# Patient Record
Sex: Female | Born: 1980 | Hispanic: Yes | Smoking: Never smoker
Health system: Southern US, Community
[De-identification: ages and names within clinical notes are randomized; demographics above are authoritative.]

## PROBLEM LIST (undated history)

## (undated) DIAGNOSIS — Z789 Other specified health status: Secondary | ICD-10-CM

## (undated) DIAGNOSIS — A159 Respiratory tuberculosis unspecified: Secondary | ICD-10-CM

## (undated) HISTORY — DX: Other specified health status: Z78.9

## (undated) HISTORY — PX: OTHER SURGICAL HISTORY: SHX169

## (undated) HISTORY — DX: Respiratory tuberculosis unspecified: A15.9

---
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
## 2004-12-19 ENCOUNTER — Ambulatory Visit (HOSPITAL_COMMUNITY): Admission: RE | Admit: 2004-12-19 | Discharge: 2004-12-19 | Payer: Self-pay | Admitting: Obstetrics and Gynecology

## 2009-01-25 ENCOUNTER — Emergency Department (HOSPITAL_COMMUNITY): Admission: EM | Admit: 2009-01-25 | Discharge: 2009-01-25 | Payer: Self-pay | Admitting: Emergency Medicine

## 2009-02-13 ENCOUNTER — Encounter: Payer: Self-pay | Admitting: Maternal & Fetal Medicine

## 2009-02-23 ENCOUNTER — Encounter: Payer: Self-pay | Admitting: Maternal & Fetal Medicine

## 2009-06-22 ENCOUNTER — Encounter: Payer: Self-pay | Admitting: Obstetrics & Gynecology

## 2009-07-03 ENCOUNTER — Encounter: Payer: Self-pay | Admitting: Maternal & Fetal Medicine

## 2009-07-03 ENCOUNTER — Ambulatory Visit: Payer: Self-pay | Admitting: Family Medicine

## 2009-07-29 ENCOUNTER — Observation Stay: Payer: Self-pay | Admitting: Obstetrics and Gynecology

## 2009-08-07 ENCOUNTER — Inpatient Hospital Stay: Payer: Self-pay | Admitting: Obstetrics and Gynecology

## 2010-07-26 LAB — GC/CHLAMYDIA PROBE AMP, GENITAL
Chlamydia, DNA Probe: NEGATIVE
GC Probe Amp, Genital: NEGATIVE

## 2010-07-26 LAB — CBC
HCT: 41 % (ref 36.0–46.0)
Hemoglobin: 14.3 g/dL (ref 12.0–15.0)
MCHC: 34.8 g/dL (ref 30.0–36.0)
RBC: 4.53 MIL/uL (ref 3.87–5.11)
WBC: 9.4 10*3/uL (ref 4.0–10.5)

## 2010-07-26 LAB — URINALYSIS, ROUTINE W REFLEX MICROSCOPIC
Glucose, UA: NEGATIVE mg/dL
Nitrite: NEGATIVE
Specific Gravity, Urine: 1.01 (ref 1.005–1.030)
Urobilinogen, UA: 0.2 mg/dL (ref 0.0–1.0)

## 2010-07-26 LAB — WET PREP, GENITAL
Trich, Wet Prep: NONE SEEN
Yeast Wet Prep HPF POC: NONE SEEN

## 2010-07-26 LAB — DIFFERENTIAL
Basophils Absolute: 0 10*3/uL (ref 0.0–0.1)
Lymphocytes Relative: 17 % (ref 12–46)
Neutro Abs: 7.1 10*3/uL (ref 1.7–7.7)

## 2010-07-26 LAB — URINE CULTURE: Colony Count: >=100000

## 2010-07-26 LAB — URINE MICROSCOPIC-ADD ON

## 2010-07-26 LAB — PREGNANCY, URINE: Preg Test, Ur: POSITIVE

## 2014-08-03 ENCOUNTER — Other Ambulatory Visit (HOSPITAL_COMMUNITY): Payer: Self-pay | Admitting: Physician Assistant

## 2014-08-03 DIAGNOSIS — R102 Pelvic and perineal pain: Secondary | ICD-10-CM

## 2014-08-05 ENCOUNTER — Other Ambulatory Visit (HOSPITAL_COMMUNITY): Payer: Self-pay | Admitting: Physician Assistant

## 2014-08-05 DIAGNOSIS — R102 Pelvic and perineal pain unspecified side: Secondary | ICD-10-CM

## 2014-08-05 DIAGNOSIS — R1011 Right upper quadrant pain: Secondary | ICD-10-CM

## 2014-08-05 DIAGNOSIS — R1031 Right lower quadrant pain: Secondary | ICD-10-CM

## 2014-08-08 ENCOUNTER — Ambulatory Visit (HOSPITAL_COMMUNITY): Payer: Self-pay

## 2014-08-08 ENCOUNTER — Ambulatory Visit (HOSPITAL_COMMUNITY)
Admission: RE | Admit: 2014-08-08 | Discharge: 2014-08-08 | Disposition: A | Discharge status: Home / Self Care | Payer: Self-pay | Source: Ambulatory Visit | Attending: Physician Assistant | Admitting: Physician Assistant

## 2014-08-08 ENCOUNTER — Other Ambulatory Visit (HOSPITAL_COMMUNITY): Payer: Self-pay | Admitting: Physician Assistant

## 2014-08-08 DIAGNOSIS — R102 Pelvic and perineal pain: Secondary | ICD-10-CM | POA: Insufficient documentation

## 2014-08-08 DIAGNOSIS — R1011 Right upper quadrant pain: Secondary | ICD-10-CM

## 2014-08-08 DIAGNOSIS — R1031 Right lower quadrant pain: Secondary | ICD-10-CM | POA: Insufficient documentation

## 2019-03-16 ENCOUNTER — Encounter (HOSPITAL_COMMUNITY): Payer: Self-pay

## 2019-03-16 ENCOUNTER — Emergency Department (HOSPITAL_COMMUNITY): Payer: No Typology Code available for payment source

## 2019-03-16 ENCOUNTER — Other Ambulatory Visit: Payer: Self-pay

## 2019-03-16 ENCOUNTER — Emergency Department (HOSPITAL_COMMUNITY)
Admission: EM | Admit: 2019-03-16 | Discharge: 2019-03-16 | Disposition: A | Discharge status: Home / Self Care | Payer: No Typology Code available for payment source | Attending: Emergency Medicine | Admitting: Emergency Medicine

## 2019-03-16 DIAGNOSIS — T148XXA Other injury of unspecified body region, initial encounter: Secondary | ICD-10-CM | POA: Insufficient documentation

## 2019-03-16 DIAGNOSIS — T07XXXA Unspecified multiple injuries, initial encounter: Secondary | ICD-10-CM

## 2019-03-16 DIAGNOSIS — Y9389 Activity, other specified: Secondary | ICD-10-CM | POA: Diagnosis not present

## 2019-03-16 DIAGNOSIS — S0990XA Unspecified injury of head, initial encounter: Secondary | ICD-10-CM | POA: Diagnosis present

## 2019-03-16 DIAGNOSIS — Y92410 Unspecified street and highway as the place of occurrence of the external cause: Secondary | ICD-10-CM | POA: Diagnosis not present

## 2019-03-16 DIAGNOSIS — Y999 Unspecified external cause status: Secondary | ICD-10-CM | POA: Diagnosis not present
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
## 2019-03-16 NOTE — Discharge Instructions (Signed)
Rest as needed.  Use ice on sore areas 3 times a day for 2 days after that use heat.  For pain use ibuprofen 400 mg 3 times a day with meals.  See your doctor if you are not better in 1 week.
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
## 2020-02-15 ENCOUNTER — Other Ambulatory Visit: Payer: Self-pay

## 2020-02-15 ENCOUNTER — Ambulatory Visit (INDEPENDENT_AMBULATORY_CARE_PROVIDER_SITE_OTHER): Payer: Self-pay | Admitting: Dermatology

## 2020-02-15 DIAGNOSIS — L821 Other seborrheic keratosis: Secondary | ICD-10-CM

## 2020-02-15 NOTE — Progress Notes (Signed)
   Follow-Up Visit   Subjective  Melissa Odonnell is a 39 y.o. female who presents for the following: dark papules on the face (patient would like to discuss removal and treatment options).  She has had these removed in past several years ago here in office, but they have gradually come back.  The following portions of the chart were reviewed this encounter and updated as appropriate:     Review of Systems:  No other skin or systemic complaints except as noted in HPI or Assessment and Plan.  Objective  Well appearing patient in no apparent distress; mood and affect are within normal limits.  A focused examination was performed including the face . Relevant physical exam findings are noted in the Assessment and Plan.  Objective  Face: Cheeks, forehead, temples - multiple tiny waxy tan papules  Images        Assessment & Plan  Seborrheic keratosis Face  Discussed cosmetic removal (ED) and cost with patient. She would like to remove as many as possible today for a flat rate of $350. We treated >100 today. Will touch up once at no charge. Recommend Vaseline for healing lesions. Recommend photoprotection while healing.  Destruction of lesion - Face  Destruction method comment:  Electrodesiccation performed today Informed consent: discussed and consent obtained   Timeout:  patient name, date of birth, surgical site, and procedure verified Hemostasis achieved with:  electrodesiccation Outcome: patient tolerated procedure well with no complications   Post-procedure details: wound care instructions given     Return in about 7 weeks (around 04/04/2020) for SK follow up - may need to be retreated.  Maylene Roes, CMA, am acting as scribe for Willeen Niece, MD .  Documentation: I have reviewed the above documentation for accuracy and completeness, and I agree with the above.  Willeen Niece MD

## 2020-04-10 ENCOUNTER — Ambulatory Visit (INDEPENDENT_AMBULATORY_CARE_PROVIDER_SITE_OTHER): Payer: Self-pay | Admitting: Dermatology

## 2020-04-10 ENCOUNTER — Other Ambulatory Visit: Payer: Self-pay

## 2020-04-10 DIAGNOSIS — L821 Other seborrheic keratosis: Secondary | ICD-10-CM

## 2020-04-10 NOTE — Patient Instructions (Addendum)
Cryotherapy Aftercare  . Wash gently with soap and water everyday.   Marland Kitchen Apply Vaseline and Band-Aid daily until healed.   Recommend broad spectrum SPF and photoprotection.

## 2020-04-10 NOTE — Progress Notes (Signed)
° °  Follow-Up Visit   Subjective  Melissa Odonnell is a 39 y.o. female who presents for the following: Follow-up (Cosmetic Sks of the face >100 treated with ED, improving ).  Here for post-op visit and touch up treatment.  A little improved, but still a lot there.   The following portions of the chart were reviewed this encounter and updated as appropriate:       Review of Systems:  No other skin or systemic complaints except as noted in HPI or Assessment and Plan.  Objective  Well appearing patient in no apparent distress; mood and affect are within normal limits.  A focused examination was performed including face. Relevant physical exam findings are noted in the Assessment and Plan.  Objective  Right Cheek x 19, Left cheek x 20 (39): Tiny waxy tan papules.   Assessment & Plan  Seborrheic keratosis (39) Right Cheek x 19, Left cheek x 20  Some improvement with ED treatment. Photos compared.  Will touch-up larger areas with LN2 today. No charge. Prior to procedure, discussed risks of blister formation, small wound, skin dyspigmentation, or rare scar following cryotherapy.    Recommend broad spectrum SPF and photoprotection     Destruction of lesion - Right Cheek x 19, Left cheek x 20  Destruction method: cryotherapy   Informed consent: discussed and consent obtained   Lesion destroyed using liquid nitrogen: Yes   Region frozen until ice ball extended beyond lesion: Yes   Outcome: patient tolerated procedure well with no complications   Post-procedure details: wound care instructions given    Return in about 8 weeks (around 06/05/2020) for f/u SKs.   ICherlyn Labella, CMA, am acting as scribe for Willeen Niece, MD .  Documentation: I have reviewed the above documentation for accuracy and completeness, and I agree with the above.  Willeen Niece MD

## 2020-06-05 ENCOUNTER — Ambulatory Visit: Payer: Self-pay | Admitting: Dermatology
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
## 2022-06-26 ENCOUNTER — Ambulatory Visit: Payer: Self-pay

## 2022-06-26 ENCOUNTER — Ambulatory Visit (LOCAL_COMMUNITY_HEALTH_CENTER): Payer: Self-pay

## 2022-06-26 DIAGNOSIS — Z719 Counseling, unspecified: Secondary | ICD-10-CM

## 2022-06-26 DIAGNOSIS — Z23 Encounter for immunization: Secondary | ICD-10-CM

## 2022-06-26 NOTE — Progress Notes (Signed)
Pt seen in nurse clinic for Immigration vaccines. Pt needs Hep B, Tdap, MMR, Varicella, Flu and Covid vaccines. Pt is uninsured, paid Flu vaccine, eligible for free Twinrix ( pt plan to travel to [place] once she completed paper work this year), Tdap, Varicella and MMR per standing order. Pt re-schedule appt. for Covid vaccine this coming Monday 07/01/22. Administered Twinrix, Tdap, MMR, Varicella and  Flu vaccines, tolerated well. Provided VIS statements and NCIR copies, informed of the next vaccine visits, pt verbalized understanding. Also informed pt that she maybe qualify for free vaccines through [employer], provided [employer] application form. M.Jeramiah Mccaughey, LPN.

## 2022-07-01 ENCOUNTER — Ambulatory Visit (LOCAL_COMMUNITY_HEALTH_CENTER): Payer: Self-pay

## 2022-07-01 ENCOUNTER — Ambulatory Visit: Payer: Self-pay

## 2022-07-01 DIAGNOSIS — Z23 Encounter for immunization: Secondary | ICD-10-CM

## 2022-07-01 DIAGNOSIS — Z719 Counseling, unspecified: Secondary | ICD-10-CM

## 2022-07-01 NOTE — Progress Notes (Signed)
Patient seen in nurse clinic for COVID vaccine.  Moderna/Spikevax +12Y 2023-24 IM left deltoid. VIS provided. Tolerated well. Waited 10 minutes. 2 copies of NCIR provided. Discussed return dates for vaccine shots.   Patient completed [employer] Application for Varicella and provided tax returns. Varicella recommended for 07/24/2022.  Patient has appointment for [id number] - application and forms waiting to be seen to [employer].

## 2022-07-18 ENCOUNTER — Ambulatory Visit (LOCAL_COMMUNITY_HEALTH_CENTER): Payer: Self-pay

## 2022-07-18 ENCOUNTER — Other Ambulatory Visit: Payer: Self-pay

## 2022-07-18 VITALS — Wt 134.0 lb

## 2022-07-18 DIAGNOSIS — R7612 Nonspecific reaction to cell mediated immunity measurement of gamma interferon antigen response without active tuberculosis: Secondary | ICD-10-CM

## 2022-07-18 NOTE — Progress Notes (Signed)
Patient referred by Cindie Laroche, DO [place], [place]. Patient was seen by DO for immigration purposes.   +QFT3/08/2022 CXR  07/04/2022 EPI 07/18/2022   Nurse reviewed with patient the differences between LTBI and active TB disease. Explained that it is a 4 month commitment [place]. RN informed patient that it is free and optional. Patient given opportunity to ask questions.   Patient denies TB sxs, alcohol consumption and smoking. NKA. Patient has had 2 c-sections and is not using BC. Patient reports she is using condoms. LMP 07/09/2022.    Patient would like to start LTBI treatment. Appointment scheduled for Tuesday 08/06/2022 at 4:00 PM.  Servando Salina, RN

## 2022-07-23 ENCOUNTER — Telehealth: Payer: Self-pay

## 2022-07-23 NOTE — Telephone Encounter (Signed)
Call to patient with Melissa Odonnell ([place]). RN reviewed [facility] with patient who reports she is using condoms. RN informed patient that she needs to use them. Patient verbalized understanding.  Servando Salina, RN

## 2022-07-25 ENCOUNTER — Ambulatory Visit: Payer: Self-pay

## 2022-07-25 ENCOUNTER — Ambulatory Visit (LOCAL_COMMUNITY_HEALTH_CENTER): Payer: Self-pay

## 2022-07-25 DIAGNOSIS — Z23 Encounter for immunization: Secondary | ICD-10-CM

## 2022-07-25 DIAGNOSIS — Z719 Counseling, unspecified: Secondary | ICD-10-CM

## 2022-07-25 NOTE — Progress Notes (Signed)
Pt returned to nurse clinic for 2nd dose Twinrix, MMR and Varicella needs for Immigration. Pt meets criteria for free Twinrix, and MMR from state supply and free Varicella vaccine approved by [employer] from private supply. Administered vaccines, tolerated well. Given VIS and NCIR copies, explained and understood. M.Chenee Munns, LPN.

## 2022-07-30 ENCOUNTER — Other Ambulatory Visit: Payer: Self-pay

## 2022-07-30 NOTE — Progress Notes (Signed)
Patient is 42 yr old referred by Sanda Klein, DO in [place], [place].    +QFT3/08/2022 CXR  07/04/2022 EPI 3/28/202   Tuberculosis treatment orders   All patients are to be monitored per [place] and county TB policies.    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 Treat for latent TB per the following:  Rifampin 600mg  daily by mouth x 4 months per Dr. Wyvonnia Lora Standing Orders.  Offer HIV/Syphilis   Patient only needs labs if concerning symptoms arise, new potentially hepatotoxic medications, or significant alcohol consumption is reported that would require monitoring.    Augustin Schooling, RN

## 2022-08-06 ENCOUNTER — Ambulatory Visit (LOCAL_COMMUNITY_HEALTH_CENTER): Payer: Self-pay

## 2022-08-06 VITALS — Wt 137.0 lb

## 2022-08-06 DIAGNOSIS — R7612 Nonspecific reaction to cell mediated immunity measurement of gamma interferon antigen response without active tuberculosis: Secondary | ICD-10-CM

## 2022-08-06 LAB — HM HIV SCREENING LAB: HM HIV Screening: NEGATIVE

## 2022-08-06 MED ORDER — RIFAMPIN 300 MG PO CAPS: 600.0000 mg | ORAL_CAPSULE | Freq: Every day | ORAL | 0 refills | Status: AC

## 2022-08-06 NOTE — Progress Notes (Signed)
In nurse clinic for LTBI / TB Med Start / Rifampin #1 / Labs: HIV and RPR Patient speaks and understands [place], declines interpreter today.   Current meds: claritin and multivitamin Last alcohol 2 yrs ago. Counseled about no alcohol while on Rifampin Patient states sexually active, no birth control method. Counseled by RN about not becoming pregnant while on Rifampin. Pt verbalizes understanding. Condoms declined today.  Patient counseled regarding decreased efficacy of hormone based birth control while taking Rifampin. Patient reminded of the importance using a back-up method of birth control while taking Rifampin. Patient verbalized understanding.    Latent vs Active TB info sheet  and Rifampin info sheet given and reviewed.  LTBI Tx Consent signed. HIV testing consent signed. TB coord card given and advised to contact with questions, concerns, side effects.   The patient was dispensed Rifampin 300 mg #60 (bottle #1)today per order by Dr Levonne Hubert. I provided counseling today regarding the medication. We discussed the medication, the side effects and when to call clinic. Patient given the opportunity to ask questions. Questions answered.    RN walked pt to lab for HIV and RPR today.  Next TB med appt scheduled 09/03/2022 at 3:15 per pt preference. ROI not signed today and plan to have pt sign at next visit. Jerel Shepherd, RN

## 2022-08-23 ENCOUNTER — Ambulatory Visit: Payer: Self-pay

## 2022-08-26 ENCOUNTER — Ambulatory Visit: Payer: Self-pay

## 2022-08-26 ENCOUNTER — Ambulatory Visit (LOCAL_COMMUNITY_HEALTH_CENTER): Payer: Self-pay

## 2022-08-26 DIAGNOSIS — Z719 Counseling, unspecified: Secondary | ICD-10-CM

## 2022-08-26 NOTE — Progress Notes (Signed)
Patient seen in nurse clinic for immunizations.  Patient stated she was here "for 2nd shot".  Patient did not know the vaccine needed.  Review of immunization record showed no vaccinations needed until 12/27/2022.  NCIR printed and reviewed with patient.  Next vaccination Twinrix due 12/27/2022.  NCIR copy provided to patient. Discussed calling in first of August to schedule for September appointment.

## 2022-09-03 ENCOUNTER — Ambulatory Visit (LOCAL_COMMUNITY_HEALTH_CENTER): Payer: Self-pay

## 2022-09-03 VITALS — Wt 134.5 lb

## 2022-09-03 DIAGNOSIS — R7612 Nonspecific reaction to cell mediated immunity measurement of gamma interferon antigen response without active tuberculosis: Secondary | ICD-10-CM

## 2022-09-03 MED ORDER — RIFAMPIN 300 MG PO CAPS: 600.0000 mg | ORAL_CAPSULE | Freq: Every day | ORAL | 0 refills | Status: AC

## 2022-09-03 NOTE — Progress Notes (Signed)
MD Attestation for TB RN: I agree with the care provided to this patient and the plan for follow up and treatment.  Dalynn Jhaveri M. Montrice Gracey, MD  

## 2022-09-03 NOTE — Progress Notes (Signed)
In nurse clinic for LTBI / TB Med Management / Rifampin #2 / LFT's  Patient reports taking Rifampin as prescribed. Denies missing any pills. Has #4 pills remaining in bottle.   Patient explains she has had a "little bit" of abd pain each week but not every day. States she has not paid much attention to it.  Also explains she has noticed "brown" urine approx twice this past month. Patient relates this to not drinking enough water. RN counseled on staying hydrated.  Patient notes that after taking one dose of Dextromethorphan for cough, she noticed small rash on back that disappeared the next day and no problems since.   Consult Dr Wyvonnia Lora who orders LFT's today and to continue Rifampin as prescribed.   RN discussed provider recommendations and pt in agreement.   TB coord contact card given and advised to contact with questions, concerns, side effects.  Declines Rifampin info sheet as she has one at home.   The patient was dispensed Rifampin 300 mg #60 today per order by Dr Levonne Hubert. I provided counseling today regarding the medication. We discussed the medication, the side effects and when to call clinic. Patient given the opportunity to ask questions. Questions answered.    Patient counseled regarding decreased efficacy of hormone based birth control while taking Rifampin. Patient reminded of the importance using a back-up method of birth control while taking Rifampin. Patient verbalized understanding.  Condoms accepted today.   RN walked pt to lab for LFT's. Next TB med appt scheduled for 10/01/2022 at 3:15 pm per pt preference. Jerel Shepherd, RN

## 2022-09-04 LAB — HEPATIC FUNCTION PANEL
ALT: 16 IU/L (ref 0–32)
AST: 20 IU/L (ref 0–40)
Albumin: 4.5 g/dL (ref 3.9–4.9)
Alkaline Phosphatase: 70 IU/L (ref 44–121)
Bilirubin Total: <0.2 mg/dL (ref 0.0–1.2)
Bilirubin, Direct: <0.1 mg/dL (ref 0.00–0.40)
Total Protein: 7.2 g/dL (ref 6.0–8.5)

## 2022-09-25 DIAGNOSIS — R7612 Nonspecific reaction to cell mediated immunity measurement of gamma interferon antigen response without active tuberculosis: Secondary | ICD-10-CM | POA: Insufficient documentation

## 2022-10-01 ENCOUNTER — Ambulatory Visit (LOCAL_COMMUNITY_HEALTH_CENTER): Payer: Self-pay

## 2022-10-01 DIAGNOSIS — R7612 Nonspecific reaction to cell mediated immunity measurement of gamma interferon antigen response without active tuberculosis: Secondary | ICD-10-CM

## 2022-10-01 DIAGNOSIS — Z227 Latent tuberculosis: Secondary | ICD-10-CM

## 2022-10-01 MED ORDER — RIFAMPIN 300 MG PO CAPS: 600.0000 mg | ORAL_CAPSULE | Freq: Every day | ORAL | 0 refills | Status: AC

## 2022-10-01 NOTE — Progress Notes (Signed)
In nurse clinic for LTBI / Med Management / Rifampin #3. Patient has been taking Rifampin 600mg  daily for 2 months for LTBI treatment.  Patient is doing well on current medication regimen. No n/v/f/c, eating normally, no concerning weight loss. Patient reports taking medications daily as prescribed.  Patient has about 12 pills left and states she has missed taking her pills for 1 day. Patient advised to finish the remaining pills in their old bottle, then start on the next bottle of medication.  Dispensed #3 month of Rifampin for LTBI tx. Dispensed #60 300 mg capsules.   I provided counseling today regarding the medication, we discussed the medication, the side effects and when to call clinic. Patient given the opportunity to ask questions.   Patient advised to contact [facility]/TB control phone for any concerning symptoms or questions.  Patient's next visit has been scheduled for: 10/30/22 at 3:15PM.  Abagail Kitchens, RN

## 2022-10-30 ENCOUNTER — Ambulatory Visit (LOCAL_COMMUNITY_HEALTH_CENTER): Payer: Self-pay

## 2022-10-30 VITALS — Wt 137.5 lb

## 2022-10-30 DIAGNOSIS — R7612 Nonspecific reaction to cell mediated immunity measurement of gamma interferon antigen response without active tuberculosis: Secondary | ICD-10-CM

## 2022-10-30 MED ORDER — RIFAMPIN 300 MG PO CAPS: 600.0000 mg | ORAL_CAPSULE | Freq: Every day | ORAL | 0 refills | Status: AC

## 2022-10-30 NOTE — Progress Notes (Signed)
In nurse clinic for LTBI / TB Med Completion/ Rifampin # 4  / Labs: None today  Taking Rifampin as prescribed. Per pt, missed one day of Rifampin last month.  #14 pills  (7 day supply) remaining in current bottle.  Advised to complete this bottle before starting bottle that will be dispensed today.   No Changes in  Meds  New Health problems:  patient explains she has had R sided pelvic pain "for years" but is noticing it more often  and has always felt  a "ball" lower R pelvic area. No PCP and RN gave list of local PCP and encouraged to establish. [facility] services also reviewed.   Patient states she wants to get pregnant sometime after stopping Rifampin. RN counseled patient to wait one month after completing Rifampin before seeking pregnancy. "Are you ready for baby?" Booklet given. Preconception and achieving preg counseling given.   Alcohol: denies. Advised to abstain from alcohol as Rifampin can damage the liver.   BCM:  Patient counseled regarding decreased efficacy of hormone based birth control while taking Rifampin. Patient reminded of the importance using a back-up method of birth control while taking Rifampin. Patient verbalized understanding. Condoms declined. Patient reports not using   The patient was dispensed Rifampin 300 mg #60(Rifampin #4) today per order by Dr Levonne Hubert. I provided counseling today regarding the medication. We discussed the medication, the side effects and when to call clinic. Patient given the opportunity to ask questions. Questions answered.    TB contact card given and advised to contact with questions, concerns, side effects.   TB completion patient letter (Spanish and [place]) given and explained.  TB completion card given.  TB contact card given.  Questions answered and reports understanding. Jerel Shepherd, RN

## 2022-11-25 ENCOUNTER — Ambulatory Visit: Payer: Self-pay

## 2022-12-16 ENCOUNTER — Ambulatory Visit: Payer: Self-pay

## 2023-01-07 ENCOUNTER — Ambulatory Visit (LOCAL_COMMUNITY_HEALTH_CENTER): Payer: Self-pay | Admitting: Family Medicine

## 2023-01-07 ENCOUNTER — Encounter: Payer: Self-pay | Admitting: Family Medicine

## 2023-01-07 VITALS — BP 105/57 | HR 64 | Ht 61.6 in | Wt 135.4 lb

## 2023-01-07 DIAGNOSIS — Z113 Encounter for screening for infections with a predominantly sexual mode of transmission: Secondary | ICD-10-CM

## 2023-01-07 DIAGNOSIS — Z01419 Encounter for gynecological examination (general) (routine) without abnormal findings: Secondary | ICD-10-CM

## 2023-01-07 DIAGNOSIS — Z309 Encounter for contraceptive management, unspecified: Secondary | ICD-10-CM

## 2023-01-07 LAB — WET PREP FOR TRICH, YEAST, CLUE
Trichomonas Exam: NEGATIVE
Yeast Exam: NEGATIVE

## 2023-01-07 LAB — HM HIV SCREENING LAB: HM HIV Screening: NEGATIVE

## 2023-01-07 NOTE — Progress Notes (Unsigned)
PT is here ford PE, pap smear and STD testing.  Wet mount results reviewed, no treatment required per SO.  FP packet given.  Berdie Ogren, RN

## 2023-01-07 NOTE — Progress Notes (Unsigned)
[name] DEPARTMENT [facility] [address] Main Number: [phone]   Family Planning Visit- Initial Visit  Subjective:  Melissa Odonnell Melissa Odonnell is a 42 y.o.  [id number]   being seen today for an initial annual visit and to discuss reproductive life planning.  The patient is currently using Female Condom sometimes for pregnancy prevention. Patient reports   does want a pregnancy in the next year, "but I want my ovary fixed first.".    Patient reports they are not particularly looking for a method, but she would like some condoms to take home.   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  Chief Complaint  Patient presents with   Annual Exam    PE and pap smear    Patient reports vaginal burning and lower back pain when urinating. She endorses headaches that began with the start of TB medication, and an "inflamed ovary". Please see ROS for further details.    Patient denies multiple partners or same sex partners.   Body mass index is 25.09 kg/m. - Patient is eligible for diabetes screening based on BMI> 25 and age >35?  yes HA1C ordered? yes  Patient reports 1  partner/s in last year. Desires STI screening?  Yes  Has patient been screened once for HCV in the past?  No  No results found for: "HCVAB"  Does the patient have current drug use (including MJ), have a partner with drug use, and/or has been incarcerated since last result? No  If yes-- Screen for HCV through [facility]   Does the patient meet criteria for HBV testing? No  Criteria:  -Household, sexual or needle sharing contact with HBV -History of drug use -HIV positive -Those with known Hep C   Health Maintenance Due  Topic Date Due   Hepatitis C Screening  Never done   Cervical Cancer Screening (HPV/Pap Cotest)  Never done   COVID-19 Vaccine (2 - Moderna risk series) 07/29/2022   INFLUENZA VACCINE   11/21/2022    Review of Systems  Constitutional: Negative.   HENT: Negative.    Eyes: Negative.   Respiratory: Negative.    Cardiovascular: Negative.   Gastrointestinal:  Positive for abdominal pain.       Lower abdominal pain located approximately over the right ovary.   Genitourinary:  Positive for dysuria.       Patient describes vaginal burning with pain in her lower back when urinating. This started on 12/18/22, 2 days before her last menstrual cycle began on 12/20/22. She tried using cranberry juice and Monistat cream for a couple of days until her period began. She endorses the symptoms did improve but have come back.   She also states she has an "inflamed ovary" to the right side that fluctuates in size related to her menstrual cycle. She reports it is associated with a discomfort. She states this has been going on a while. She has not seen a provider about this or tried any at home treatments.     Musculoskeletal: Negative.   Skin:        Patient reports a darkening of the skin around her eyes bilaterally.   Neurological:  Positive for headaches.       Patient endorses headaches that began 1 month ago when she began TB treatment. She states they occur everyday and last from morning until night. She has not tried any treatment.   Endo/Heme/Allergies: Negative.  Psychiatric/Behavioral: Negative.      The following portions of the patient's history were reviewed and updated as appropriate: allergies, current medications, past family history, past medical history, past social history, past surgical history and problem list. Problem list updated.   See flowsheet for other program required questions.  Objective:   Vitals:   01/07/23 1421  BP: (!) 105/57  Pulse: 64  Weight: 135 lb 6.4 oz (61.4 kg)  Height: 5' 1.6" (1.565 m)    Physical Exam Vitals and nursing note reviewed. Exam conducted with a chaperone present (Irina T. Spanish interpreter chaperone present in the  room.).  Constitutional:      General: She is not in acute distress.    Appearance: Normal appearance. She is not ill-appearing.  HENT:     Head: Normocephalic.     Salivary Glands: Right salivary gland is not diffusely enlarged or tender. Left salivary gland is not diffusely enlarged or tender.     Mouth/Throat:     Lips: Pink.     Mouth: Mucous membranes are moist.     Tongue: No lesions.     Pharynx: Oropharynx is clear. Uvula midline. No oropharyngeal exudate.     Tonsils: No tonsillar exudate.  Eyes:     General:        Right eye: No discharge.        Left eye: No discharge.  Pulmonary:     Effort: Pulmonary effort is normal.  Chest:     Chest wall: Tenderness present. No mass.  Breasts:    Tanner Score is 5.     Right: Tenderness present. No swelling, bleeding, inverted nipple, mass, nipple discharge or skin change.     Left: No swelling, bleeding, inverted nipple, mass, nipple discharge, skin change or tenderness.  Abdominal:     General: Abdomen is flat. A surgical scar is present. There is no distension.     Palpations: Abdomen is soft. There is mass.     Tenderness: There is abdominal tenderness. There is right CVA tenderness. There is no left CVA tenderness, guarding or rebound.       Comments: Site marked with an X on diagram above: Patient reports tenderness to palpation over approximate location of right ovary. On palpation a possible mass is felt, but it cannot be determined without appropriate diagnostic testing (CT, TVUS) what this could be. Possibly 2-3 cm in size and not perfectly rounded. This mass does not protrude and is not obvious to the naked eye. Transverse mark on abdominal diagram above representative of a well healed surgical scar. This was approximately 16 cm across.  Genitourinary:    General: Normal vulva.     Exam position: Lithotomy position.     Pubic Area: No rash or pubic lice.      Tanner stage (genital): 5.     Labia:        Right: No  rash, tenderness or lesion.        Left: No rash, tenderness or lesion.      Vagina: No vaginal discharge.     Cervix: Discharge, friability and erythema present. No cervical motion tenderness.     Uterus: Normal.      Adnexa: Left adnexa normal.       Right: Mass and tenderness present.         Comments: Erythematous irregularly shaped area above cervical os to extending from about 10 o'clock to 1 o'clock. Possible lesion noted to 11 o'clock area, but due to  friability unable to definitively discern. Cytology with HPV obtained today.  Lymphadenopathy:     Head:     Right side of head: No submental, submandibular, tonsillar, preauricular or posterior auricular adenopathy.     Left side of head: No submental, submandibular, tonsillar, preauricular or posterior auricular adenopathy.     Cervical: No cervical adenopathy.     Right cervical: No superficial or posterior cervical adenopathy.    Left cervical: No superficial or posterior cervical adenopathy.     Upper Body:     Right upper body: No supraclavicular adenopathy.     Left upper body: No supraclavicular adenopathy.     Lower Body: No right inguinal adenopathy. No left inguinal adenopathy.  Skin:    General: Skin is warm and dry.          Comments: Darkening of the skin around eyes to the upper maxillary/lower orbital bones bilaterally. Possibly representative of melasma.   Neurological:     Mental Status: She is alert and oriented to person, place, and time.  Psychiatric:        Attention and Perception: Attention normal.        Mood and Affect: Mood normal.        Speech: Speech normal.        Behavior: Behavior normal. Behavior is cooperative.       Assessment and Plan:  Melissa Odonnell is a 42 y.o. female presenting to the [facility] Department for an initial annual wellness/contraceptive visit  Contraception counseling: Reviewed options based on patient desire and reproductive life plan. Patient  is interested in Female Condom. This was provided to the patient today.   Risks, benefits, and typical effectiveness rates were reviewed.  Questions were answered.  Written information was also given to the patient to review.    The patient will follow up as needed for surveillance.  The patient was told to call with any further questions, or with any concerns about this method of contraception.  Emphasized use of condoms 100% of the time for STI prevention.  Educated on ECP and assessed for need of ECP. Patient reported Unprotected sex within past 72 hours.  Reviewed options and patient desired No method of ECP, declined all    1. Screening for venereal disease  - Chlamydia/Gonorrhea [place] [facility] - HIV [place] [facility] - Syphilis Serology, [facility] - WET PREP FOR TRICH, YEAST, CLUE - Gonococcus culture   2. Well woman exam  Patient strongly encouraged to locate a PCP to establish care and obtain a complete head to toe physical exam with appropriate lab screenings. She was advised they would be the best option for seeking care related to her headaches.  She was strongly encouraged to seek care from an urgent care, ED, or PCP for symptoms of possible UTI.  She was encouraged to locate an OBGYN to inquire about a transvaginal US to evaluate her concerns of an inflamed ovary.  Patient referred to [facility] for mammogram.  - Hgb A1c w/o eAG - IGP, Aptima HPV   Return if symptoms worsen or fail to improve.  No future appointments.  Due to language barrier, an interpreter was present during the history-taking and subsequent discussion and for the physical exam with this patient.  Edmonia James, NP

## 2023-01-08 NOTE — Progress Notes (Signed)

## 2023-01-12 LAB — GONOCOCCUS CULTURE

## 2023-01-25 LAB — IGP, APTIMA HPV
HPV Aptima: NEGATIVE
PAP Smear Comment: 0

## 2023-01-25 LAB — SPECIMEN STATUS REPORT

## 2023-01-28 NOTE — Progress Notes (Signed)
Please send letter to patient to repeat PAP with HPV co-testing in 5 years. Cervical cytology from 01/07/23 reported as "unsatisfactory for evaluation;" however, HPV was negative. Reviewing ASCCP guidelines, a 5 year repeat PAP with HPV co-testing is appropriate.

## 2023-03-11 ENCOUNTER — Ambulatory Visit: Payer: Self-pay

## 2023-12-29 ENCOUNTER — Ambulatory Visit: Attending: Urology

## 2023-12-29 ENCOUNTER — Other Ambulatory Visit: Payer: Self-pay

## 2023-12-29 DIAGNOSIS — R279 Unspecified lack of coordination: Secondary | ICD-10-CM | POA: Insufficient documentation

## 2023-12-29 DIAGNOSIS — M5459 Other low back pain: Secondary | ICD-10-CM | POA: Diagnosis present

## 2023-12-29 DIAGNOSIS — R293 Abnormal posture: Secondary | ICD-10-CM | POA: Insufficient documentation

## 2023-12-29 DIAGNOSIS — M6281 Muscle weakness (generalized): Secondary | ICD-10-CM | POA: Diagnosis present

## 2023-12-29 DIAGNOSIS — M62838 Other muscle spasm: Secondary | ICD-10-CM | POA: Insufficient documentation

## 2023-12-29 DIAGNOSIS — R1030 Lower abdominal pain, unspecified: Secondary | ICD-10-CM | POA: Diagnosis present

## 2023-12-29 NOTE — Therapy (Signed)
 OUTPATIENT PHYSICAL THERAPY FEMALE PELVIC EVALUATION   Patient Name: Melissa Odonnell MRN: [id number] DOB:[birth year], 43 y.o., female Today's Date: 12/29/2023  END OF SESSION:  PT End of Session - 12/29/23 0931     Visit Number 1    Date for PT Re-Evaluation 06/14/24    Authorization Type [employer]    Authorization Time Period waiting on auth    PT Start Time 0931    PT Stop Time 1012    PT Time Calculation (min) 41 min    Activity Tolerance Patient tolerated treatment well    Behavior During Therapy [facility] for tasks assessed/performed          Past Medical History:  Diagnosis Date   Patient denies medical problems    TB (tuberculosis)    Past Surgical History:  Procedure Laterality Date   two c-sections     Patient Active Problem List   Diagnosis Date Noted   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    PCP: NA  REFERRING PROVIDER: Devere Lonni Righter, MD    REFERRING DIAG: 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 MD told her that she is not emptying bladder completely and that is why. She was was using catheters, but MD has told her to stop using.    PAIN:  Are you having pain? Yes NPRS scale: 5/10 Pain location: Rt side of low back; pressure in lower abdomen; Rt lower abdominal pain  Pain type: aching Pain description: intermittent    Aggravating factors: having to urinate, sitting, sleeping  Relieving factors: exercise  PRECAUTIONS: None  RED FLAGS: None   WEIGHT BEARING RESTRICTIONS: No  FALLS:  Has patient fallen in last 6 months? No  OCCUPATION: cleaning  ACTIVITY LEVEL : weight training, pilates  PLOF: Independent  PATIENT GOALS: be able to use the bathroom better   PERTINENT HISTORY:  2 c-sections, chronic UTIs  Sexual abuse: No  BOWEL MOVEMENT: Pain with bowel movement: No Type of bowel movement:Frequency 2x/day and Strain yes Fully empty rectum: Yes:   Leakage: No Pads: Yes: see  below Fiber supplement/laxative No  URINATION: Pain with urination: No Fully empty bladder: No Stream: starts and stops, sometimes slow stream Urgency: Yes - doesn't have to go, but has sensation she needs to go Frequency: up to 4x/hour during the day; 2x/night Fluid Intake: 60-90 oz; she does drink coffee  Leakage: none Pads: Yes: feels like there is a little there after she wipes and does not want this in her underwear  INTERCOURSE:  Ability to have vaginal penetration Yes  Pain with intercourse: Initial Penetration, During Penetration, and Deep Penetration DrynessYes  Climax: WNL [name] Scale: 2/3 Lubricant: no  PREGNANCY: Vaginal deliveries 0 Tearing No Episiotomy No C-section deliveries 2 Currently pregnant No  PROLAPSE: Pressure   OBJECTIVE:  Note: Objective measures were completed at Evaluation unless otherwise noted.  12/29/23: PATIENT SURVEYS:   PFIQ-7: 55  COGNITION: Overall cognitive status: Within functional limits for tasks assessed     SENSATION: Light touch: Appears intact   FUNCTIONAL TESTS:  Squat: WNL Single leg stance:  Rt: pelvic drop  Lt: pelvic drop Curl-up test: no distortion    GAIT: Assistive device utilized: None Comments: WNL  POSTURE: decreased lumbar lordosis, decreased thoracic kyphosis, and posterior pelvic  tilt   LUMBARAROM/PROM:  A/PROM A/PROM  Eval (% available)  Flexion 75  Extension 25, Rt abdominal pain  Right lateral flexion 75  Left lateral flexion 75  Right rotation 75  Left rotation 75   (Blank rows = not tested)  PALPATION:   General: rigger points Rt glutes   Pelvic Alignment: Rt posterior rotation  Abdominal: tightness throughout bil lower abdomen; increased pain and tightness in Rt lower quadrant compared to LT                External Perineal Exam: WNL                             Internal Pelvic Floor: significant tenderness throughout bil superficial and deep layers; burning in superficial, aching in deep  Patient confirms identification and approves PT to assess internal pelvic floor and treatment Yes  PELVIC MMT:   MMT eval  Vaginal 1/5, 3 repeat contractions  Diastasis Recti 2 finger widths   (Blank rows = not tested)        TONE: High, bil  PROLAPSE: WNL  TODAY'S TREATMENT:                                                                                                                              DATE:  12/29/23 EVAL  Neuromuscular re-education: Diaphragmatic breathing  Cat cow Child's pose Butterfly Exercises: Lower trunk rotation  Seated piriformis stretch Kneeling hip flexor stretch  For all possible CPT codes, reference the Planned Interventions line above.     Check all conditions that are expected to impact treatment: {Conditions expected to impact treatment:None of these apply   If treatment provided at initial evaluation,  no treatment charged due to lack of authorization.       PATIENT EDUCATION:  Education details: See above Person educated: Patient Education method: Explanation, Demonstration, Tactile cues, Verbal cues, and Handouts Education comprehension: verbalized understanding  HOME EXERCISE PROGRAM: [id number]  ASSESSMENT:  CLINICAL IMPRESSION: Patient is a 43 y.o. female who was seen today for physical therapy  evaluation and treatment for incomplete bladder emptying/frequent UTIs and low back/abdominal pain. Exam findings notable for decreased lumbar extension with pain, abnormal posture and Rt posterior pelvic rotation, pelvic drop in single leg stance, lower abdominal tightness and scar tissue restriction with tenderness, trigger points in Rt glutes, 2 finger width diastasis recti abdominus, high pelvic floor muscle tone bil with burning and aching reported, significant pelvic floor muscle weakness, inability to relax pelvic floor muscles after contraction, and poor coordination. Signs and symptoms are most consistent with high tone pelvic floor muscles and lower abdominal muscular and scar tissue restriction; believe this tightness and restriction is preventing bladder from completely emptying. Initial treatment consisted of down training, mobility exercises, and diaphragmatic breathing for improved pelvic floor muscle relaxation and proprioception. She will continue to benefit from skilled PT intervention in order to decrease pain, improve bladder emptying, improve pain with intercourse, address impairments, and improve quality of life.   OBJECTIVE IMPAIRMENTS: decreased activity tolerance, decreased coordination, decreased endurance, decreased mobility, decreased ROM, decreased strength, increased fascial restrictions, increased muscle spasms, impaired flexibility, impaired tone, improper body mechanics, postural dysfunction, and pain.   ACTIVITY LIMITATIONS: sitting, sleeping, and urinating  PARTICIPATION LIMITATIONS: cleaning, interpersonal relationship, driving, community activity, and occupation  PERSONAL FACTORS: 1 comorbidity: medical history are also affecting patient's functional outcome.   REHAB POTENTIAL: Good  CLINICAL DECISION MAKING: Stable/uncomplicated  EVALUATION COMPLEXITY: Low   GOALS: Goals reviewed with patient? Yes  SHORT TERM GOALS: Target date: 01/26/2024    Pt will be  independent with HEP in order to improve activity tolerance.   Baseline: Goal status: INITIAL  2.  Patient will report 25% improve in abdominal/low back pain in order to increase activity tolerance.   Baseline: 5/10 Goal status: INITIAL  3.  Pt will be independent with double voiding in order to more complete empty bladder.   Baseline: not suing  Goal status: INITIAL  4.  Pt will be independent with diaphragmatic breathing and down training activities in order to improve pelvic floor relaxation and complete bladder emptying.  Baseline:  Goal status: INITIAL  5.  Pt will decrease frequency of urination to 1x/hour in order to decrease time spent in the bathroom and demonstrate more complete bladder emptying.  Baseline: 4x/hour Goal status: INITIAL  6.  Pt will report sensation of complete bladder emptying in order to reduce number of UTIs.  Baseline: does not feel complete bladder emptying Goal status: INITIAL  LONG TERM GOALS: Target date: 06/14/2024  Pt will be independent with advanced HEP in order to improve activity tolerance.   Baseline:  Goal status: INITIAL  2.  Patient will report 75% improve in abdominal/low back pain in order to increase activity tolerance.   Baseline: 5/10 Goal status: INITIAL  3.  Pt will be able to go 2-3 hours in between voids without urgency or incontinence in order to work without having to go to the bathroom as often.   Baseline: 4x/hour Goal status: INITIAL  4.  Pt will report 0/10 pain with vaginal penetration in order to improve intimate relationship with partner.    Baseline:  Goal status: INITIAL  5.  Pt deny any UTIs in order in the last month in order to prevent life threatening infection.  Baseline: frequent UTIs Goal status: INITIAL  6.  Pt will demonstrate normal pelvic floor muscle tone and A/ROM, able to achieve 3/5 strength with contractions and 10 sec endurance, in order to reduce urinary leaking and number of pads  patient wears.   Baseline: high tone without full relaxation, 1/5 strength Goal status: INITIAL  PLAN:  PT FREQUENCY: 1-2x/week  PT DURATION: 14 visits    PLANNED INTERVENTIONS: 97164- PT Re-evaluation, 97110-Therapeutic exercises, 97530- Therapeutic activity, 97112- Neuromuscular re-education, 97535- Self Care, [id number]- Manual therapy, [phone]- Gait training, [phone]- Aquatic Therapy, [phone]- Electrical stimulation (unattended), [phone]- Traction (mechanical), [id number]- Ionotophoresis 4mg /ml Dexamethasone, [id number] (1-2 muscles), [id number] (3+ muscles)- Dry Needling, Patient/Family education, Balance training, Taping, Joint mobilization, Joint manipulation, Spinal manipulation, Spinal mobilization, Scar mobilization, Vestibular training, Cryotherapy, Moist heat, and Biofeedback  PLAN FOR NEXT SESSION: Progress down training, manual techniques to abdominal, internal pelvic floor muscle release, diaphragmatic breathing training, core strengthening  Josette Mares, PT, DPT09/11/2509:37 AM

## 2024-02-09 ENCOUNTER — Ambulatory Visit: Attending: Urology

## 2024-02-09 DIAGNOSIS — R279 Unspecified lack of coordination: Secondary | ICD-10-CM | POA: Diagnosis present

## 2024-02-09 DIAGNOSIS — R293 Abnormal posture: Secondary | ICD-10-CM | POA: Diagnosis present

## 2024-02-09 DIAGNOSIS — R1030 Lower abdominal pain, unspecified: Secondary | ICD-10-CM | POA: Insufficient documentation

## 2024-02-09 DIAGNOSIS — M62838 Other muscle spasm: Secondary | ICD-10-CM | POA: Insufficient documentation

## 2024-02-09 DIAGNOSIS — M6281 Muscle weakness (generalized): Secondary | ICD-10-CM | POA: Insufficient documentation

## 2024-02-09 DIAGNOSIS — M5459 Other low back pain: Secondary | ICD-10-CM | POA: Insufficient documentation

## 2024-02-09 NOTE — Therapy (Signed)
 OUTPATIENT PHYSICAL THERAPY FEMALE PELVIC TREATMENT   Patient Name: Melissa Odonnell MRN: [id number] DOB:09-02-1980, 43 y.o., female Today's Date: 02/09/2024  END OF SESSION:  PT End of Session - 02/09/24 0934     Visit Number 2    Date for Recertification  06/14/24    Authorization Type [employer]    Authorization Time Period 12/29/2023 - 03/22/2024    Authorization - Visit Number 1    Authorization - Number of Visits 14    PT Start Time 0930    PT Stop Time 1009    PT Time Calculation (min) 39 min    Activity Tolerance Patient tolerated treatment well    Behavior During Therapy [name] for tasks assessed/performed           Past Medical History:  Diagnosis Date   Patient denies medical problems    TB (tuberculosis)    Past Surgical History:  Procedure Laterality Date   two c-sections     Patient Active Problem List   Diagnosis Date Noted   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    PCP: NA  REFERRING PROVIDER: Devere Lonni Righter, MD    REFERRING DIAG: 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 She feels like her urine smells different.    PAIN: 02/09/24 Are you having pain? Yes NPRS scale: 5/10 Pain location: Rt side of low back; pressure in lower abdomen; Rt lower abdominal pain  Pain type: aching Pain description: intermittent   Aggravating factors: having to urinate, sitting, sleeping   Relieving factors: exercise  PRECAUTIONS: None  RED FLAGS: None   WEIGHT BEARING RESTRICTIONS: No  FALLS:  Has patient fallen in last 6 months? No  OCCUPATION: cleaning  ACTIVITY LEVEL : weight training, pilates  PLOF: Independent  PATIENT GOALS: be able to use the bathroom better   PERTINENT HISTORY:  2 c-sections, chronic UTIs  Sexual abuse: No  BOWEL MOVEMENT: Pain with bowel movement: No Type of bowel movement:Frequency 2x/day and Strain yes Fully empty rectum: Yes:   Leakage: No Pads: Yes: see below Fiber supplement/laxative No  URINATION: Pain with urination: No  Fully empty bladder: No Stream: starts and stops, sometimes slow stream Urgency: Yes - doesn't have to go, but has sensation she needs to go Frequency: up to 4x/hour during the day; 2x/night Fluid Intake: 60-90 oz; she does drink coffee  Leakage: none Pads: Yes: feels like there is a little there after she wipes and does not want this in her underwear  INTERCOURSE:  Ability to have vaginal penetration Yes  Pain with intercourse: Initial Penetration, During Penetration, and Deep Penetration DrynessYes  Climax: WNL [name] Scale: 2/3 Lubricant: no  PREGNANCY: Vaginal deliveries 0 Tearing No Episiotomy No C-section deliveries 2 Currently pregnant No  PROLAPSE: Pressure   OBJECTIVE:  Note: Objective measures were completed at Evaluation unless otherwise noted.  12/29/23: PATIENT SURVEYS:   PFIQ-7: 76  COGNITION: Overall cognitive status: Within functional limits for tasks assessed     SENSATION: Light touch: Appears intact   FUNCTIONAL TESTS:  Squat: WNL Single leg stance:  Rt: pelvic drop  Lt: pelvic drop Curl-up test: no distortion    GAIT: Assistive device utilized: None Comments: WNL  POSTURE: decreased lumbar lordosis, decreased thoracic kyphosis, and posterior pelvic tilt   LUMBARAROM/PROM:  A/PROM A/PROM  Eval (% available)  Flexion 75  Extension 25, Rt  abdominal pain  Right lateral flexion 75  Left lateral flexion 75  Right rotation 75  Left rotation 75   (Blank rows = not tested)  PALPATION:   General: rigger points Rt glutes   Pelvic Alignment: Rt posterior rotation  Abdominal: tightness throughout bil lower abdomen; increased pain and tightness in Rt lower quadrant compared to LT                External Perineal Exam: WNL                             Internal Pelvic Floor: significant tenderness throughout bil superficial and deep layers; burning in superficial, aching in deep  Patient confirms identification and approves PT to assess internal pelvic floor and treatment Yes  PELVIC MMT:   MMT eval  Vaginal 1/5, 3 repeat contractions  Diastasis Recti 2 finger widths   (Blank rows = not tested)        TONE: High, bil  PROLAPSE: WNL  TODAY'S TREATMENT:                                                                                                                              DATE:  02/09/24 Manual: Supine abdominal soft tissue mobilization  Supine external bladder mobilization  Neuromuscular re-education: Supine hip adduction ball press with transversus abdominus and pelvic floor muscle contractions and breath coordination 10x Bridge with hip adduction, transversus abdominus, and pelvic floor muscle 2 x 10 Exercises: Supine lower trunk rotation 2 x 10 Modified thomas stretch 60 sec bil Supine piriformis stretch 60 sec bil Open books 10x bil Butterfly 60 seconds Sidelying clams shells 2  x 10 bil  12/29/23 EVAL  Neuromuscular re-education: Diaphragmatic breathing  Cat cow Child's pose Butterfly Exercises: Lower trunk rotation  Seated piriformis stretch Kneeling hip flexor stretch  For all possible CPT codes, reference the Planned Interventions line above.     Check all conditions that are expected to impact treatment: {Conditions expected to impact treatment:None of these apply   If treatment provided  at initial evaluation, no treatment charged due to lack of authorization.       PATIENT EDUCATION:  Education details: See above Person educated: Patient Education method: Explanation, Demonstration, Tactile cues, Verbal cues, and Handouts Education comprehension: verbalized understanding  HOME EXERCISE PROGRAM: [id number]  ASSESSMENT:  CLINICAL IMPRESSION: Patient is a 43 y.o. female who was seen today for physical therapy treatment for incomplete bladder emptying/frequent UTIs and low back/abdominal pain. Pt has not seen any changes in condition so far, but has not regularly been performing HEP. We started working on abdominal and bladder mobilization with manual techniques with good tolerance. With hip flexor/abdominal stretches she reported increased tightness on Rt compared to Lt. Good tolerance to all exercises to improve mobility and relaxation of abdominal and pelvic floor muscle muscles and tissues. She will continue to benefit from skilled PT intervention in order to decrease pain, improve bladder emptying, improve pain with intercourse, address impairments, and improve quality of life.   OBJECTIVE IMPAIRMENTS: decreased activity tolerance, decreased coordination, decreased endurance, decreased mobility, decreased ROM, decreased strength, increased fascial restrictions, increased muscle spasms, impaired flexibility, impaired tone, improper body mechanics, postural dysfunction, and pain.   ACTIVITY LIMITATIONS: sitting, sleeping, and urinating  PARTICIPATION LIMITATIONS: cleaning, interpersonal relationship, driving, community activity, and occupation  PERSONAL FACTORS: 1 comorbidity: medical history are also affecting patient's functional outcome.   REHAB POTENTIAL: Good  CLINICAL DECISION MAKING: Stable/uncomplicated  EVALUATION COMPLEXITY: Low   GOALS: Goals reviewed with patient? Yes  SHORT TERM GOALS: Target date: 01/26/2024    Pt will be independent with HEP in  order to improve activity tolerance.   Baseline: Goal status: MET 02/09/24  2.  Patient will report 25% improve in abdominal/low back pain in order to increase activity tolerance.   Baseline: 5/10 Goal status: IN PROGRESS 02/09/24  3.  Pt will be independent with double voiding in order to more complete empty bladder.   Baseline: not suing  Goal status:  IN PROGRESS 02/09/24  4.  Pt will be independent with diaphragmatic breathing and down training activities in order to improve pelvic floor relaxation and complete bladder emptying.  Baseline:  Goal status:  IN PROGRESS 02/09/24  5.  Pt will decrease frequency of urination to 1x/hour in order to decrease time spent in the bathroom and demonstrate more complete bladder emptying.  Baseline: 4x/hour Goal status:  IN PROGRESS 02/09/24  6.  Pt will report sensation of complete bladder emptying in order to reduce number of UTIs.  Baseline: does not feel complete bladder emptying Goal status:  IN PROGRESS 02/09/24  LONG TERM GOALS: Target date: 06/14/2024  Pt will be independent with advanced HEP in order to improve activity tolerance.   Baseline:  Goal status:  IN PROGRESS 02/09/24  2.  Patient will report 75% improve in abdominal/low back pain in order to increase activity tolerance.   Baseline: 5/10 Goal status:  IN PROGRESS 02/09/24  3.  Pt will be able to go 2-3 hours in between voids without urgency or incontinence in order to work without having to go to the bathroom as often.  Baseline: 4x/hour Goal status:  IN PROGRESS 02/09/24  4.  Pt will report 0/10 pain with vaginal penetration in order to improve intimate relationship with partner.    Baseline:  Goal status:  IN PROGRESS 02/09/24  5.  Pt deny any UTIs in order in the last month in order to prevent life threatening infection.  Baseline: frequent UTIs Goal status:  IN PROGRESS 02/09/24  6.  Pt will demonstrate normal pelvic floor muscle tone and A/ROM, able  to achieve 3/5 strength with contractions and 10 sec endurance, in order to reduce urinary leaking and number of pads patient wears.   Baseline: high tone without full relaxation, 1/5 strength Goal status:  IN PROGRESS 02/09/24  PLAN:  PT FREQUENCY: 1-2x/week  PT DURATION: 14 visits    PLANNED INTERVENTIONS: 97164- PT Re-evaluation, 97110-Therapeutic exercises, 97530- Therapeutic activity, 97112- Neuromuscular re-education, 97535- Self Care, 02859- Manual therapy, [phone]- Gait training, [phone]- Aquatic Therapy, [phone]- Electrical stimulation (unattended), [phone]- Traction (mechanical), [id number]- Ionotophoresis 4mg /ml Dexamethasone, 79439 (1-2 muscles), 20561 (3+ muscles)- Dry Needling, Patient/Family education, Balance training, Taping, Joint mobilization, Joint manipulation, Spinal manipulation, Spinal mobilization, Scar mobilization, Vestibular training, Cryotherapy, Moist heat, and Biofeedback  PLAN FOR NEXT SESSION: Progress down training, manual techniques to abdominal, internal pelvic floor muscle release, diaphragmatic breathing training, core strengthening  Josette Mares, PT, DPT10/20/2510:09 AM

## 2024-02-16 ENCOUNTER — Ambulatory Visit

## 2024-02-16 DIAGNOSIS — R279 Unspecified lack of coordination: Secondary | ICD-10-CM

## 2024-02-16 DIAGNOSIS — R1030 Lower abdominal pain, unspecified: Secondary | ICD-10-CM

## 2024-02-16 DIAGNOSIS — M5459 Other low back pain: Secondary | ICD-10-CM

## 2024-02-16 DIAGNOSIS — M62838 Other muscle spasm: Secondary | ICD-10-CM

## 2024-02-16 DIAGNOSIS — M6281 Muscle weakness (generalized): Secondary | ICD-10-CM

## 2024-02-16 DIAGNOSIS — R293 Abnormal posture: Secondary | ICD-10-CM

## 2024-02-16 NOTE — Therapy (Signed)
 OUTPATIENT PHYSICAL THERAPY FEMALE PELVIC TREATMENT   Patient Name: Melissa Odonnell MRN: [id number] DOB:1981-03-18, 43 y.o., female Today's Date: 02/16/2024  END OF SESSION:  PT End of Session - 02/16/24 0942     Visit Number 3    Date for Recertification  06/14/24    Authorization Type [employer]    Authorization Time Period 12/29/2023 - 03/22/2024    Authorization - Visit Number 2    Authorization - Number of Visits 14    PT Start Time 0940    PT Stop Time 1012    PT Time Calculation (min) 32 min    Activity Tolerance Patient tolerated treatment well    Behavior During Therapy [facility] for tasks assessed/performed            Past Medical History:  Diagnosis Date   Patient denies medical problems    TB (tuberculosis)    Past Surgical History:  Procedure Laterality Date   two c-sections     Patient Active Problem List   Diagnosis Date Noted   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    PCP: NA  REFERRING PROVIDER: Devere Lonni Righter, MD    REFERRING DIAG: 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 She feels like she can relax more during exercises.   PAIN: 02/16/24 Are you having pain? Yes NPRS scale: 3/10 Pain location: Rt side of low back; pressure in lower abdomen; Rt lower abdominal pain  Pain type: aching Pain description: intermittent   Aggravating  factors: having to urinate, sitting, sleeping  Relieving factors: exercise  PRECAUTIONS: None  RED FLAGS: None   WEIGHT BEARING RESTRICTIONS: No  FALLS:  Has patient fallen in last 6 months? No  OCCUPATION: cleaning  ACTIVITY LEVEL : weight training, pilates  PLOF: Independent  PATIENT GOALS: be able to use the bathroom better   PERTINENT HISTORY:  2 c-sections, chronic UTIs  Sexual abuse: No  BOWEL MOVEMENT: Pain with bowel movement: No Type of bowel movement:Frequency 2x/day and Strain yes Fully empty rectum: Yes:   Leakage: No Pads: Yes: see below Fiber  supplement/laxative No  URINATION: Pain with urination: No Fully empty bladder: No Stream: starts and stops, sometimes slow stream Urgency: Yes - doesn't have to go, but has sensation she needs to go Frequency: up to 4x/hour during the day; 2x/night Fluid Intake: 60-90 oz; she does drink coffee  Leakage: none Pads: Yes: feels like there is a little there after she wipes and does not want this in her underwear  INTERCOURSE:  Ability to have vaginal penetration Yes  Pain with intercourse: Initial Penetration, During Penetration, and Deep Penetration DrynessYes  Climax: WNL [name] Scale: 2/3 Lubricant: no  PREGNANCY: Vaginal deliveries 0 Tearing No Episiotomy No C-section deliveries 2 Currently pregnant No  PROLAPSE: Pressure   OBJECTIVE:  Note: Objective measures were completed at Evaluation unless otherwise noted.  12/29/23: PATIENT SURVEYS:   PFIQ-7: 65  COGNITION: Overall cognitive status: Within functional limits for tasks assessed     SENSATION: Light touch: Appears intact   FUNCTIONAL TESTS:  Squat: WNL Single leg stance:  Rt: pelvic drop  Lt: pelvic drop Curl-up test: no distortion    GAIT: Assistive device utilized: None Comments: WNL  POSTURE: decreased lumbar lordosis, decreased thoracic kyphosis, and posterior pelvic tilt   LUMBARAROM/PROM:  A/PROM A/PROM  Eval  (% available)  Flexion 75  Extension 25, Rt abdominal pain  Right lateral flexion 75  Left lateral flexion 75  Right rotation 75  Left rotation 75   (Blank rows = not tested)  PALPATION:   General: rigger points Rt glutes   Pelvic Alignment: Rt posterior rotation  Abdominal: tightness throughout bil lower abdomen; increased pain and tightness in Rt lower quadrant compared to LT                External Perineal Exam: WNL                             Internal Pelvic Floor: significant tenderness throughout bil superficial and deep layers; burning in superficial, aching in deep  Patient confirms identification and approves PT to assess internal pelvic floor and treatment Yes  PELVIC MMT:   MMT eval  Vaginal 1/5, 3 repeat contractions  Diastasis Recti 2 finger widths   (Blank rows = not tested)        TONE: High, bil  PROLAPSE: WNL  TODAY'S TREATMENT:                                                                                                                              DATE:  02/16/24 Manual: Prone myofascial release to bil lumbar paraspinals, obliques, and quadratus lumborum with use of negative pressure soft tissue mobilization  Prone trigger point release to Rt quadratus lumborum Exercises: Prone windshield wipers 20x Supine single knee to chest 5x bil Supine bridge + adduction 2 x 10 Supine active straight leg raise 10x bil  02/09/24 Manual: Supine abdominal soft tissue mobilization  Supine external bladder  mobilization  Neuromuscular re-education: Supine hip adduction ball press with transversus abdominus and pelvic floor muscle contractions and breath coordination 10x Bridge with hip adduction, transversus abdominus, and pelvic floor muscle 2 x 10 Exercises: Supine lower trunk rotation 2 x 10 Modified thomas stretch 60 sec bil Supine piriformis stretch 60 sec bil Open books 10x bil Butterfly 60 seconds Sidelying clams shells 2 x 10  bil  12/29/23 EVAL  Neuromuscular re-education: Diaphragmatic breathing  Cat cow Child's pose Butterfly Exercises: Lower trunk rotation  Seated piriformis stretch Kneeling hip flexor stretch  For all possible CPT codes, reference the Planned Interventions line above.     Check all conditions that are expected to impact treatment: {Conditions expected to impact treatment:None of these apply   If treatment provided at initial evaluation, no treatment charged due to lack of authorization.       PATIENT EDUCATION:  Education details: See above Person educated: Patient Education method: Explanation, Demonstration, Tactile cues, Verbal cues, and Handouts Education comprehension: verbalized understanding  HOME EXERCISE PROGRAM: [id number]  ASSESSMENT:  CLINICAL IMPRESSION: Patient is a 43 y.o. female who was seen today for physical therapy treatment for incomplete bladder emptying/frequent UTIs and low back/abdominal pain. Pt reports good tolerance to last treatment with improved overall relaxation. We focused manual techniques on Rt side of low back today; she had referral into Rt lower abdomen reproducing her familiar pain with trigger point release in Rt quadratus lumborum. Good tolerance and good reduction in soft tissue restriction throughout treatment; no increase in pain with exercises following manual treatment. She will continue to benefit from skilled PT intervention in order to decrease pain, improve bladder emptying, improve pain with intercourse, address impairments, and improve quality of life.   OBJECTIVE IMPAIRMENTS: decreased activity tolerance, decreased coordination, decreased endurance, decreased mobility, decreased ROM, decreased strength, increased fascial restrictions, increased muscle spasms, impaired flexibility, impaired tone, improper body mechanics, postural dysfunction, and pain.   ACTIVITY LIMITATIONS: sitting, sleeping, and urinating  PARTICIPATION  LIMITATIONS: cleaning, interpersonal relationship, driving, community activity, and occupation  PERSONAL FACTORS: 1 comorbidity: medical history are also affecting patient's functional outcome.   REHAB POTENTIAL: Good  CLINICAL DECISION MAKING: Stable/uncomplicated  EVALUATION COMPLEXITY: Low   GOALS: Goals reviewed with patient? Yes  SHORT TERM GOALS: Target date: 01/26/2024    Pt will be independent with HEP in order to improve activity tolerance.   Baseline: Goal status: MET 02/09/24  2.  Patient will report 25% improve in abdominal/low back pain in order to increase activity tolerance.   Baseline: 5/10 Goal status: IN PROGRESS 02/09/24  3.  Pt will be independent with double voiding in order to more complete empty bladder.   Baseline: not suing  Goal status:  IN PROGRESS 02/09/24  4.  Pt will be independent with diaphragmatic breathing and down training activities in order to improve pelvic floor relaxation and complete bladder emptying.  Baseline:  Goal status:  IN PROGRESS 02/09/24  5.  Pt will decrease frequency of urination to 1x/hour in order to decrease time spent in the bathroom and demonstrate more complete bladder emptying.  Baseline: 4x/hour Goal status:  IN PROGRESS 02/09/24  6.  Pt will report sensation of complete bladder emptying in order to reduce number of UTIs.  Baseline: does not feel complete bladder emptying Goal status:  IN PROGRESS 02/09/24  LONG TERM GOALS: Target date: 06/14/2024  Pt will be independent with advanced HEP in order to improve activity tolerance.   Baseline:  Goal status:  IN  PROGRESS 02/09/24  2.  Patient will report 75% improve in abdominal/low back pain in order to increase activity tolerance.   Baseline: 5/10 Goal status:  IN PROGRESS 02/09/24  3.  Pt will be able to go 2-3 hours in between voids without urgency or incontinence in order to work without having to go to the bathroom as often.   Baseline:  4x/hour Goal status:  IN PROGRESS 02/09/24  4.  Pt will report 0/10 pain with vaginal penetration in order to improve intimate relationship with partner.    Baseline:  Goal status:  IN PROGRESS 02/09/24  5.  Pt deny any UTIs in order in the last month in order to prevent life threatening infection.  Baseline: frequent UTIs Goal status:  IN PROGRESS 02/09/24  6.  Pt will demonstrate normal pelvic floor muscle tone and A/ROM, able to achieve 3/5 strength with contractions and 10 sec endurance, in order to reduce urinary leaking and number of pads patient wears.   Baseline: high tone without full relaxation, 1/5 strength Goal status:  IN PROGRESS 02/09/24  PLAN:  PT FREQUENCY: 1-2x/week  PT DURATION: 14 visits    PLANNED INTERVENTIONS: 97164- PT Re-evaluation, 97110-Therapeutic exercises, 97530- Therapeutic activity, 97112- Neuromuscular re-education, 97535- Self Care, 02859- Manual therapy, [phone]- Gait training, [phone]- Aquatic Therapy, [phone]- Electrical stimulation (unattended), [phone]- Traction (mechanical), [id number]- Ionotophoresis 4mg /ml Dexamethasone, 79439 (1-2 muscles), 20561 (3+ muscles)- Dry Needling, Patient/Family education, Balance training, Taping, Joint mobilization, Joint manipulation, Spinal manipulation, Spinal mobilization, Scar mobilization, Vestibular training, Cryotherapy, Moist heat, and Biofeedback  PLAN FOR NEXT SESSION: Progress down training, manual techniques to abdominal, internal pelvic floor muscle release, diaphragmatic breathing training, core strengthening  Josette Mares, PT, DPT10/27/2510:11 AM

## 2024-02-24 ENCOUNTER — Ambulatory Visit: Attending: Urology

## 2024-02-24 DIAGNOSIS — M5459 Other low back pain: Secondary | ICD-10-CM | POA: Diagnosis present

## 2024-02-24 DIAGNOSIS — R293 Abnormal posture: Secondary | ICD-10-CM | POA: Insufficient documentation

## 2024-02-24 DIAGNOSIS — R279 Unspecified lack of coordination: Secondary | ICD-10-CM | POA: Diagnosis present

## 2024-02-24 DIAGNOSIS — M62838 Other muscle spasm: Secondary | ICD-10-CM | POA: Diagnosis present

## 2024-02-24 DIAGNOSIS — M6281 Muscle weakness (generalized): Secondary | ICD-10-CM | POA: Diagnosis present

## 2024-02-24 DIAGNOSIS — R1030 Lower abdominal pain, unspecified: Secondary | ICD-10-CM | POA: Diagnosis present

## 2024-02-24 NOTE — Therapy (Signed)
 OUTPATIENT PHYSICAL THERAPY FEMALE PELVIC TREATMENT   Patient Name: Melissa Odonnell MRN: [id number] DOB:05-03-1980, 43 y.o., female Today's Date: 02/24/2024  END OF SESSION:  PT End of Session - 02/24/24 0935     Visit Number 4    Date for Recertification  06/14/24    Authorization Type [employer]    Authorization Time Period 12/29/2023 - 03/22/2024    Authorization - Visit Number 3    Authorization - Number of Visits 14    PT Start Time 0934    PT Stop Time 1012    PT Time Calculation (min) 38 min    Activity Tolerance Patient tolerated treatment well    Behavior During Therapy [facility] for tasks assessed/performed             Past Medical History:  Diagnosis Date   Patient denies medical problems    TB (tuberculosis)    Past Surgical History:  Procedure Laterality Date   two c-sections     Patient Active Problem List   Diagnosis Date Noted   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    PCP: NA  REFERRING PROVIDER: Devere Lonni Righter, MD    REFERRING DIAG: 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    PAIN: 02/23/24 Are you having pain? Yes NPRS scale: 2/10 low back pain, 4/10 abdominal pain Pain location: Rt side of low back; pressure in lower abdomen; Rt lower abdominal pain  Pain type: aching Pain description: intermittent   Aggravating factors: having to urinate,  sitting, sleeping  Relieving factors: exercise  PRECAUTIONS: None  RED FLAGS: None   WEIGHT BEARING RESTRICTIONS: No  FALLS:  Has patient fallen in last 6 months? No  OCCUPATION: cleaning  ACTIVITY LEVEL : weight training, pilates  PLOF: Independent  PATIENT GOALS: be able to use the bathroom better   PERTINENT HISTORY:  2 c-sections, chronic UTIs  Sexual abuse: No  BOWEL MOVEMENT: Pain with bowel movement: No Type of bowel movement:Frequency 2x/day and Strain yes Fully empty rectum: Yes:   Leakage: No Pads: Yes: see below Fiber supplement/laxative No  URINATION: Pain with urination: No Fully empty bladder: No Stream: starts and stops, sometimes slow stream Urgency: Yes - doesn't have to go, but has sensation she needs to go Frequency: up to 4x/hour during the day; 2x/night Fluid Intake: 60-90 oz; she does drink coffee  Leakage: none Pads: Yes: feels like there is a little there after she wipes and does not want this in her underwear  INTERCOURSE:  Ability to have vaginal penetration Yes  Pain with intercourse: Initial Penetration, During Penetration, and Deep Penetration DrynessYes  Climax: WNL [name] Scale: 2/3 Lubricant: no  PREGNANCY: Vaginal deliveries 0 Tearing No Episiotomy No C-section deliveries 2 Currently pregnant No  PROLAPSE: Pressure   OBJECTIVE:  Note: Objective measures were completed at Evaluation unless otherwise noted.  12/29/23: PATIENT SURVEYS:   PFIQ-7: 25  COGNITION: Overall cognitive status: Within functional limits for tasks assessed     SENSATION: Light touch: Appears intact   FUNCTIONAL TESTS:  Squat: WNL Single leg stance:  Rt: pelvic drop  Lt: pelvic drop Curl-up test: no distortion    GAIT: Assistive device utilized: None Comments: WNL  POSTURE: decreased lumbar lordosis, decreased thoracic kyphosis, and posterior pelvic tilt   LUMBARAROM/PROM:  A/PROM A/PROM  Eval (% available)  Flexion 75   Extension 25, Rt abdominal pain  Right lateral flexion 75  Left lateral flexion 75  Right rotation 75  Left rotation 75   (Blank rows = not tested)  PALPATION:   General: rigger points Rt glutes   Pelvic Alignment: Rt posterior rotation  Abdominal: tightness throughout bil lower abdomen; increased pain and tightness in Rt lower quadrant compared to LT                External Perineal Exam: WNL                             Internal Pelvic Floor: significant tenderness throughout bil superficial and deep layers; burning in superficial, aching in deep  Patient confirms identification and approves PT to assess internal pelvic floor and treatment Yes  PELVIC MMT:   MMT eval  Vaginal 1/5, 3 repeat contractions  Diastasis Recti 2 finger widths   (Blank rows = not tested)        TONE: High, bil  PROLAPSE: WNL  TODAY'S TREATMENT:                                                                                                                              DATE:  02/24/24 Manual: Prone myofascial release to bil lumbar paraspinals, obliques, and quadratus lumborum with use of negative pressure soft tissue mobilization  Prone trigger point release to Rt quadratus lumborum Supine abdominal c-section scar tissue mobilization and Rt lower quadrant release  Exercises: Supine active straight leg raise 15x bil Sidelying hip abduction 15x bil Sidelying modified plank with clam shells 10x bil   02/16/24 Manual: Prone myofascial release to  bil lumbar paraspinals, obliques, and quadratus lumborum with use of negative pressure soft tissue mobilization  Prone trigger point release to Rt quadratus lumborum Exercises: Prone windshield wipers 20x Supine single knee to chest 5x bil Supine bridge + adduction 2 x 10 Supine active straight leg raise 10x bil  02/09/24 Manual: Supine abdominal soft tissue mobilization  Supine external bladder mobilization  Neuromuscular re-education: Supine  hip adduction ball press with transversus abdominus and pelvic floor muscle contractions and breath coordination 10x Bridge with hip adduction, transversus abdominus, and pelvic floor muscle 2 x 10 Exercises: Supine lower trunk rotation 2 x 10 Modified thomas stretch 60 sec bil Supine piriformis stretch 60 sec bil Open books 10x bil Butterfly 60 seconds Sidelying clams shells 2 x 10 bil   PATIENT EDUCATION:  Education details: See above Person educated: Patient Education method: Explanation, Demonstration, Tactile cues, Verbal cues, and Handouts Education comprehension: verbalized understanding  HOME EXERCISE PROGRAM: [id number]  ASSESSMENT:  CLINICAL IMPRESSION: Patient is a 44 y.o. female who was seen today for physical therapy treatment for incomplete bladder emptying/frequent UTIs and low back/abdominal pain. Pt doing better this week with pain. She still presents with notable Rt lower back soft tissue restriction and abdominal scar tissue restriction. Manual techniques performed today to address this was good tolerance. Exercises progressed with no increase in pain. She will continue to benefit from skilled PT intervention in order to decrease pain, improve bladder emptying, improve pain with intercourse, address impairments, and improve quality of life.   OBJECTIVE IMPAIRMENTS: decreased activity tolerance, decreased coordination, decreased endurance, decreased mobility, decreased ROM, decreased strength, increased fascial restrictions, increased muscle spasms, impaired flexibility, impaired tone, improper body mechanics, postural dysfunction, and pain.   ACTIVITY LIMITATIONS: sitting, sleeping, and urinating  PARTICIPATION LIMITATIONS: cleaning, interpersonal relationship, driving, community activity, and occupation  PERSONAL FACTORS: 1 comorbidity: medical history are also affecting patient's functional outcome.   REHAB POTENTIAL: Good  CLINICAL DECISION MAKING:  Stable/uncomplicated  EVALUATION COMPLEXITY: Low   GOALS: Goals reviewed with patient? Yes  SHORT TERM GOALS: Target date: 01/26/2024    Pt will be independent with HEP in order to improve activity tolerance.   Baseline: Goal status: MET 02/09/24  2.  Patient will report 25% improve in abdominal/low back pain in order to increase activity tolerance.   Baseline: 5/10 Goal status: IN PROGRESS 02/09/24  3.  Pt will be independent with double voiding in order to more complete empty bladder.   Baseline: not suing  Goal status:  IN PROGRESS 02/09/24  4.  Pt will be independent with diaphragmatic breathing and down training activities in order to improve pelvic floor relaxation and complete bladder emptying.  Baseline:  Goal status:  IN PROGRESS 02/09/24  5.  Pt will decrease frequency of urination to 1x/hour in order to decrease time spent in the bathroom and demonstrate more complete bladder emptying.  Baseline: 4x/hour Goal status:  IN PROGRESS 02/09/24  6.  Pt will report sensation of complete bladder emptying in order to reduce number of UTIs.  Baseline: does not feel complete bladder emptying Goal status:  IN PROGRESS 02/09/24  LONG TERM GOALS: Target date: 06/14/2024  Pt will be independent with advanced HEP in order to improve activity tolerance.   Baseline:  Goal status:  IN PROGRESS 02/09/24  2.  Patient will report 75% improve in abdominal/low back pain in order to increase activity tolerance.   Baseline: 5/10 Goal status:  IN PROGRESS 02/09/24  3.  Pt will be able to  go 2-3 hours in between voids without urgency or incontinence in order to work without having to go to the bathroom as often.   Baseline: 4x/hour Goal status:  IN PROGRESS 02/09/24  4.  Pt will report 0/10 pain with vaginal penetration in order to improve intimate relationship with partner.    Baseline:  Goal status:  IN PROGRESS 02/09/24  5.  Pt deny any UTIs in order in the last month in  order to prevent life threatening infection.  Baseline: frequent UTIs Goal status:  IN PROGRESS 02/09/24  6.  Pt will demonstrate normal pelvic floor muscle tone and A/ROM, able to achieve 3/5 strength with contractions and 10 sec endurance, in order to reduce urinary leaking and number of pads patient wears.   Baseline: high tone without full relaxation, 1/5 strength Goal status:  IN PROGRESS 02/09/24  PLAN:  PT FREQUENCY: 1-2x/week  PT DURATION: 14 visits    PLANNED INTERVENTIONS: 97164- PT Re-evaluation, 97110-Therapeutic exercises, 97530- Therapeutic activity, 97112- Neuromuscular re-education, 97535- Self Care, 02859- Manual therapy, [phone]- Gait training, [phone]- Aquatic Therapy, [phone]- Electrical stimulation (unattended), [phone]- Traction (mechanical), [id number]- Ionotophoresis 4mg /ml Dexamethasone, 79439 (1-2 muscles), 20561 (3+ muscles)- Dry Needling, Patient/Family education, Balance training, Taping, Joint mobilization, Joint manipulation, Spinal manipulation, Spinal mobilization, Scar mobilization, Vestibular training, Cryotherapy, Moist heat, and Biofeedback  PLAN FOR NEXT SESSION: Progress down training, manual techniques to abdominal, internal pelvic floor muscle release, diaphragmatic breathing training, core strengthening  Josette Mares, PT, DPT11/07/2508:04 AM

## 2024-03-01 ENCOUNTER — Ambulatory Visit

## 2024-03-01 DIAGNOSIS — M5459 Other low back pain: Secondary | ICD-10-CM

## 2024-03-01 DIAGNOSIS — R1030 Lower abdominal pain, unspecified: Secondary | ICD-10-CM | POA: Diagnosis not present

## 2024-03-01 DIAGNOSIS — M62838 Other muscle spasm: Secondary | ICD-10-CM

## 2024-03-01 DIAGNOSIS — R293 Abnormal posture: Secondary | ICD-10-CM

## 2024-03-01 DIAGNOSIS — M6281 Muscle weakness (generalized): Secondary | ICD-10-CM

## 2024-03-01 DIAGNOSIS — R279 Unspecified lack of coordination: Secondary | ICD-10-CM

## 2024-03-01 NOTE — Therapy (Signed)
 OUTPATIENT PHYSICAL THERAPY FEMALE PELVIC TREATMENT   Patient Name: Melissa Odonnell MRN: [id number] DOB:04/14/1981, 43 y.o., female Today's Date: 03/01/2024  END OF SESSION:  PT End of Session - 03/01/24 0922     Visit Number 5    Date for Recertification  06/14/24    Authorization Type [employer]    Authorization Time Period 12/29/2023 - 03/22/2024    Authorization - Visit Number 4    Authorization - Number of Visits 14    PT Start Time 0930    PT Stop Time 1010    PT Time Calculation (min) 40 min    Activity Tolerance Patient tolerated treatment well    Behavior During Therapy [facility] for tasks assessed/performed              Past Medical History:  Diagnosis Date   Patient denies medical problems    TB (tuberculosis)    Past Surgical History:  Procedure Laterality Date   two c-sections     Patient Active Problem List   Diagnosis Date Noted   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    PCP: NA  REFERRING PROVIDER: Devere Lonni Righter, MD    REFERRING DIAG: 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-10-CM) - Chronic cyclitis R33.9 (ICD-10-CM) - Incomplete bladder emptying  THERAPY DIAG:  Lower abdominal pain  Other low back pain  Muscle weakness (generalized)  Other muscle spasm  Unspecified lack of coordination  Abnormal posture  Rationale for Evaluation and Treatment: Rehabilitation  ONSET DATE: 04/22/2020  SUBJECTIVE:                                                                                                                                                                                           SUBJECTIVE STATEMENT: Pt states that she is not having any low back or abdominal pain.    PAIN: 03/01/24 Are you having pain? Yes NPRS scale: 0/10 Pain location: Rt side of low back; pressure in lower abdomen; Rt lower abdominal pain  Pain type: aching Pain description: intermittent   Aggravating factors: having to urinate, sitting, sleeping   Relieving factors: exercise  PRECAUTIONS: None  RED FLAGS: None   WEIGHT BEARING RESTRICTIONS: No  FALLS:  Has patient fallen in last 6 months? No  OCCUPATION: cleaning  ACTIVITY LEVEL : weight training, pilates  PLOF: Independent  PATIENT GOALS: be able to use the bathroom better   PERTINENT HISTORY:  2 c-sections, chronic UTIs  Sexual abuse: No  BOWEL MOVEMENT: Pain with bowel movement: No Type of bowel movement:Frequency 2x/day and Strain yes Fully empty rectum: Yes:   Leakage: No Pads: Yes: see below Fiber supplement/laxative No  URINATION: Pain with urination: No Fully empty bladder: No Stream: starts and stops, sometimes slow stream Urgency: Yes - doesn't have to go, but has sensation she needs to go Frequency: up to 4x/hour during the day; 2x/night Fluid Intake: 60-90 oz; she does drink coffee  Leakage: none Pads: Yes: feels like there is a little there after she wipes and does not want this in her underwear  INTERCOURSE:  Ability to have vaginal penetration Yes  Pain with intercourse: Initial Penetration, During Penetration, and Deep Penetration DrynessYes  Climax: WNL [name] Scale: 2/3 Lubricant: no  PREGNANCY: Vaginal deliveries 0 Tearing No Episiotomy No C-section deliveries 2 Currently pregnant No  PROLAPSE: Pressure   OBJECTIVE:  Note: Objective measures were completed at Evaluation unless otherwise noted.  12/29/23: PATIENT SURVEYS:   PFIQ-7: 28  COGNITION: Overall cognitive status: Within functional limits for tasks assessed     SENSATION: Light touch: Appears intact   FUNCTIONAL TESTS:  Squat: WNL Single leg stance:  Rt: pelvic drop  Lt: pelvic drop Curl-up test: no distortion    GAIT: Assistive device utilized: None Comments: WNL  POSTURE: decreased lumbar lordosis, decreased thoracic kyphosis, and posterior pelvic tilt   LUMBARAROM/PROM:  A/PROM A/PROM  Eval (% available)  Flexion 75  Extension 25, Rt  abdominal pain  Right lateral flexion 75  Left lateral flexion 75  Right rotation 75  Left rotation 75   (Blank rows = not tested)  PALPATION:   General: rigger points Rt glutes   Pelvic Alignment: Rt posterior rotation  Abdominal: tightness throughout bil lower abdomen; increased pain and tightness in Rt lower quadrant compared to LT                External Perineal Exam: WNL                             Internal Pelvic Floor: significant tenderness throughout bil superficial and deep layers; burning in superficial, aching in deep  Patient confirms identification and approves PT to assess internal pelvic floor and treatment Yes  PELVIC MMT:   MMT eval  Vaginal 1/5, 3 repeat contractions  Diastasis Recti 2 finger widths   (Blank rows = not tested)        TONE: High, bil  PROLAPSE: WNL  TODAY'S TREATMENT:                                                                                                                              DATE:  03/01/24 Manual: Lt sidelying Rt lumbar paraspinal/oblique/quadratus lumborum release Neuromuscular re-education: Bridge with 27-Mar-2025x 10 Supine dead bug 10x bil Supine full shoulder flexion + 10 lbs 2 x 10 Bear plank 10x Exercises: Sidelying hip abduction 2 x 10 bil Sidelying hip adduction 2 x 10 bil Sidelying modified plank with clam shells 2 x 10 bil Open books 10x bil Lower trunk rotation 2 x 10 Cat  cow 10x   02/24/24 Manual: Prone myofascial release to bil lumbar paraspinals, obliques, and quadratus lumborum with use of negative pressure soft tissue mobilization  Prone trigger point release to Rt quadratus lumborum Supine abdominal c-section scar tissue mobilization and Rt lower quadrant release  Exercises: Supine active straight leg raise 15x bil Sidelying hip abduction 15x bil Sidelying modified plank with clam shells 10x bil   02/16/24 Manual: Prone myofascial release to bil lumbar paraspinals, obliques, and  quadratus lumborum with use of negative pressure soft tissue mobilization  Prone trigger point release to Rt quadratus lumborum Exercises: Prone windshield wipers 20x Supine single knee to chest 5x bil Supine bridge + adduction 2 x 10 Supine active straight leg raise 10x bil    PATIENT EDUCATION:  Education details: See above Person educated: Patient Education method: Programmer, Multimedia, Demonstration, Tactile cues, Verbal cues, and Handouts Education comprehension: verbalized understanding  HOME EXERCISE PROGRAM: [id number]  ASSESSMENT:  CLINICAL IMPRESSION: Patient is a 43 y.o. female who was seen today for physical therapy treatment for incomplete bladder emptying/frequent UTIs and low back/abdominal pain. Pt continues to do much better with low back and abdominal pain. She did request to continue manual techniques to Rt low back to work on small amount of pain that is still present. She still has restriction and notable trigger point in Rt low back, but good tolerance to release techniques. She continues to do well with exercise progressions and demonstrates improving strength and coordination. She will continue to benefit from skilled PT intervention in order to decrease pain, improve bladder emptying, improve pain with intercourse, address impairments, and improve quality of life.   OBJECTIVE IMPAIRMENTS: decreased activity tolerance, decreased coordination, decreased endurance, decreased mobility, decreased ROM, decreased strength, increased fascial restrictions, increased muscle spasms, impaired flexibility, impaired tone, improper body mechanics, postural dysfunction, and pain.   ACTIVITY LIMITATIONS: sitting, sleeping, and urinating  PARTICIPATION LIMITATIONS: cleaning, interpersonal relationship, driving, community activity, and occupation  PERSONAL FACTORS: 1 comorbidity: medical history are also affecting patient's functional outcome.   REHAB POTENTIAL: Good  CLINICAL DECISION  MAKING: Stable/uncomplicated  EVALUATION COMPLEXITY: Low   GOALS: Goals reviewed with patient? Yes  SHORT TERM GOALS: Target date: 01/26/2024    Pt will be independent with HEP in order to improve activity tolerance.   Baseline: Goal status: MET 02/09/24  2.  Patient will report 25% improve in abdominal/low back pain in order to increase activity tolerance.   Baseline: 5/10; currently no pain Goal status:MET 11/10  3.  Pt will be independent with double voiding in order to more complete empty bladder.   Baseline: not suing  Goal status:  IN PROGRESS 02/09/24  4.  Pt will be independent with diaphragmatic breathing and down training activities in order to improve pelvic floor relaxation and complete bladder emptying.  Baseline:  Goal status:  IN PROGRESS 02/09/24  5.  Pt will decrease frequency of urination to 1x/hour in order to decrease time spent in the bathroom and demonstrate more complete bladder emptying.  Baseline: 4x/hour Goal status:  IN PROGRESS 02/09/24  6.  Pt will report sensation of complete bladder emptying in order to reduce number of UTIs.  Baseline: does not feel complete bladder emptying Goal status:  IN PROGRESS 02/09/24  LONG TERM GOALS: Target date: 06/14/2024  Pt will be independent with advanced HEP in order to improve activity tolerance.   Baseline:  Goal status:  IN PROGRESS 02/09/24  2.  Patient will report 75% improve in abdominal/low back pain in  order to increase activity tolerance.   Baseline: 5/10 Goal status:  IN PROGRESS 02/09/24  3.  Pt will be able to go 2-3 hours in between voids without urgency or incontinence in order to work without having to go to the bathroom as often.   Baseline: 4x/hour Goal status:  IN PROGRESS 02/09/24  4.  Pt will report 0/10 pain with vaginal penetration in order to improve intimate relationship with partner.    Baseline:  Goal status:  IN PROGRESS 02/09/24  5.  Pt deny any UTIs in order in  the last month in order to prevent life threatening infection.  Baseline: frequent UTIs Goal status:  IN PROGRESS 02/09/24  6.  Pt will demonstrate normal pelvic floor muscle tone and A/ROM, able to achieve 3/5 strength with contractions and 10 sec endurance, in order to reduce urinary leaking and number of pads patient wears.   Baseline: high tone without full relaxation, 1/5 strength Goal status:  IN PROGRESS 02/09/24  PLAN:  PT FREQUENCY: 1-2x/week  PT DURATION: 14 visits    PLANNED INTERVENTIONS: 97164- PT Re-evaluation, 97110-Therapeutic exercises, 97530- Therapeutic activity, 97112- Neuromuscular re-education, 97535- Self Care, 02859- Manual therapy, [phone]- Gait training, [phone]- Aquatic Therapy, [phone]- Electrical stimulation (unattended), [phone]- Traction (mechanical), [id number]- Ionotophoresis 4mg /ml Dexamethasone, 79439 (1-2 muscles), 20561 (3+ muscles)- Dry Needling, Patient/Family education, Balance training, Taping, Joint mobilization, Joint manipulation, Spinal manipulation, Spinal mobilization, Scar mobilization, Vestibular training, Cryotherapy, Moist heat, and Biofeedback  PLAN FOR NEXT SESSION: Progress down training, manual techniques to abdominal, internal pelvic floor muscle release, diaphragmatic breathing training, core strengthening  Josette Mares, PT, DPT11/01/2509:15 AM

## 2024-03-08 ENCOUNTER — Ambulatory Visit

## 2024-03-08 DIAGNOSIS — R1030 Lower abdominal pain, unspecified: Secondary | ICD-10-CM | POA: Diagnosis not present

## 2024-03-08 DIAGNOSIS — R293 Abnormal posture: Secondary | ICD-10-CM

## 2024-03-08 DIAGNOSIS — R279 Unspecified lack of coordination: Secondary | ICD-10-CM

## 2024-03-08 DIAGNOSIS — M5459 Other low back pain: Secondary | ICD-10-CM

## 2024-03-08 DIAGNOSIS — M62838 Other muscle spasm: Secondary | ICD-10-CM

## 2024-03-08 DIAGNOSIS — M6281 Muscle weakness (generalized): Secondary | ICD-10-CM

## 2024-03-08 NOTE — Therapy (Signed)
 OUTPATIENT PHYSICAL THERAPY FEMALE PELVIC TREATMENT   Patient Name: Melissa Odonnell MRN: [id number] DOB:1981/04/14, 43 y.o., female Today's Date: 03/08/2024  END OF SESSION:  PT End of Session - 03/08/24 0936     Visit Number 6    Date for Recertification  06/14/24    Authorization Type [employer]    Authorization Time Period 12/29/2023 - 03/22/2024    Authorization - Visit Number 5    Authorization - Number of Visits 14    PT Start Time 0935    PT Stop Time 1013    PT Time Calculation (min) 38 min    Activity Tolerance Patient tolerated treatment well    Behavior During Therapy [name] for tasks assessed/performed              Past Medical History:  Diagnosis Date   Patient denies medical problems    TB (tuberculosis)    Past Surgical History:  Procedure Laterality Date   two c-sections     Patient Active Problem List   Diagnosis Date Noted   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    PCP: NA  REFERRING PROVIDER: Devere Lonni Righter, MD    REFERRING DIAG: 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   PAIN: 03/01/24 Are you having pain? Yes NPRS scale: 0/10 Pain location: Rt side of low back; pressure in lower abdomen; Rt lower abdominal pain  Pain type: aching Pain description: intermittent   Aggravating factors: having to urinate,  sitting, sleeping  Relieving factors: exercise  PRECAUTIONS: None  RED FLAGS: None   WEIGHT BEARING RESTRICTIONS: No  FALLS:  Has patient fallen in last 6 months? No  OCCUPATION: cleaning  ACTIVITY LEVEL : weight training, pilates  PLOF: Independent  PATIENT GOALS: be able to use the bathroom better   PERTINENT HISTORY:  2 c-sections, chronic UTIs  Sexual abuse: No  BOWEL MOVEMENT: Pain with bowel movement: No Type of bowel movement:Frequency 2x/day and Strain yes Fully empty rectum: Yes:   Leakage: No Pads: Yes: see below Fiber supplement/laxative  No  URINATION: Pain with urination: No Fully empty bladder: No Stream: starts and stops, sometimes slow stream Urgency: Yes - doesn't have to go, but has sensation she needs to go Frequency: up to 4x/hour during the day; 2x/night Fluid Intake: 60-90 oz; she does drink coffee  Leakage: none Pads: Yes: feels like there is a little there after she wipes and does not want this in her underwear  INTERCOURSE:  Ability to have vaginal penetration Yes  Pain with intercourse: Initial Penetration, During Penetration, and Deep Penetration DrynessYes  Climax: WNL [name] Scale: 2/3 Lubricant: no  PREGNANCY: Vaginal deliveries 0 Tearing No Episiotomy No C-section deliveries 2 Currently pregnant No  PROLAPSE: Pressure   OBJECTIVE:  Note: Objective measures were completed at Evaluation unless otherwise noted.  12/29/23: PATIENT SURVEYS:   PFIQ-7: 85  COGNITION: Overall cognitive status: Within functional limits for tasks assessed     SENSATION: Light touch: Appears intact   FUNCTIONAL TESTS:  Squat: WNL Single leg stance:  Rt: pelvic drop  Lt: pelvic drop Curl-up test: no distortion    GAIT: Assistive device utilized: None Comments: WNL  POSTURE: decreased lumbar lordosis, decreased thoracic kyphosis, and posterior pelvic tilt   LUMBARAROM/PROM:  A/PROM A/PROM  Eval (% available)  Flexion 75   Extension 25, Rt abdominal pain  Right lateral flexion 75  Left lateral flexion 75  Right rotation 75  Left rotation 75   (Blank rows = not tested)  PALPATION:   General: rigger points Rt glutes   Pelvic Alignment: Rt posterior rotation  Abdominal: tightness throughout bil lower abdomen; increased pain and tightness in Rt lower quadrant compared to LT                External Perineal Exam: WNL                             Internal Pelvic Floor: significant tenderness throughout bil superficial and deep layers; burning in superficial, aching in deep  Patient confirms identification and approves PT to assess internal pelvic floor and treatment Yes  PELVIC MMT:   MMT eval  Vaginal 1/5, 3 repeat contractions  Diastasis Recti 2 finger widths   (Blank rows = not tested)        TONE: High, bil  PROLAPSE: WNL  TODAY'S TREATMENT:                                                                                                                              DATE:  03/08/24 Manual: Lt sidelying Rt lumbar paraspinal/oblique/quadratus lumborum release Neuromuscular re-education: Supine dead bug 2 x 10 Reverse table top full shoulder flexion + 10 lbs 2 x 10 Half kneeling chop 5 lbs bil 10x bil Standing march + 90 degrees shoulder flexion + 5 lbs 2 x 10 Bird dog 2 x 10x Bear plank 10x Standing shoulder extensions + green band 2 x 10 Pallof press +  green band 10x bil Exercises: Lower trunk rotation 2 x 10 Sidelying modified plank with clam shells 2 x 10 bil  03/01/24 Manual: Lt sidelying Rt lumbar paraspinal/oblique/quadratus lumborum release Neuromuscular re-education: Bridge with 2025/03/24x 10 Supine dead bug 10x bil Supine full shoulder flexion + 10 lbs 2 x 10 Bear plank 10x Exercises: Sidelying hip abduction 2 x 10 bil Sidelying hip adduction 2 x 10 bil Sidelying modified plank with clam shells 2 x 10 bil Open books 10x bil Lower trunk rotation 2 x 10 Cat cow  10x   02/24/24 Manual: Prone myofascial release to bil lumbar paraspinals, obliques, and quadratus lumborum with use of negative pressure soft tissue mobilization  Prone trigger point release to Rt quadratus lumborum Supine abdominal c-section scar tissue mobilization and Rt lower quadrant release  Exercises: Supine active straight leg raise 15x bil Sidelying hip abduction 15x bil Sidelying modified plank with clam shells 10x bil   PATIENT EDUCATION:  Education details: See above Person educated: Patient Education method: Programmer, Multimedia, Demonstration, Tactile cues, Verbal cues, and Handouts Education comprehension: verbalized understanding  HOME EXERCISE PROGRAM: [id number]  ASSESSMENT:  CLINICAL IMPRESSION: Patient is a 43 y.o. female who was seen today for physical therapy treatment for incomplete bladder emptying/frequent UTIs and low back/abdominal pain. Pt doing very well with controlled low back and abdominal pain over the last week. Notable improvement in tension and trigger points in Rt side of low back today. She did very well with previous exercise progressions and additional challenge. She will continue to benefit from skilled PT intervention in order to decrease pain, improve bladder emptying, improve pain with intercourse, address impairments, and improve quality of life.   OBJECTIVE IMPAIRMENTS: decreased activity tolerance, decreased coordination, decreased endurance, decreased mobility, decreased ROM, decreased strength, increased fascial restrictions, increased muscle spasms, impaired flexibility, impaired tone, improper body mechanics, postural dysfunction, and pain.   ACTIVITY LIMITATIONS: sitting, sleeping, and urinating  PARTICIPATION LIMITATIONS: cleaning, interpersonal relationship, driving, community activity, and occupation  PERSONAL FACTORS: 1 comorbidity: medical history are also affecting patient's functional outcome.   REHAB POTENTIAL: Good  CLINICAL  DECISION MAKING: Stable/uncomplicated  EVALUATION COMPLEXITY: Low   GOALS: Goals reviewed with patient? Yes  SHORT TERM GOALS: Target date: 01/26/2024    Pt will be independent with HEP in order to improve activity tolerance.   Baseline: Goal status: MET 02/09/24  2.  Patient will report 25% improve in abdominal/low back pain in order to increase activity tolerance.   Baseline: 5/10; currently no pain Goal status:MET 11/10  3.  Pt will be independent with double voiding in order to more complete empty bladder.   Baseline: not suing  Goal status:  IN PROGRESS 02/09/24  4.  Pt will be independent with diaphragmatic breathing and down training activities in order to improve pelvic floor relaxation and complete bladder emptying.  Baseline:  Goal status:  IN PROGRESS 02/09/24  5.  Pt will decrease frequency of urination to 1x/hour in order to decrease time spent in the bathroom and demonstrate more complete bladder emptying.  Baseline: 4x/hour Goal status:  IN PROGRESS 02/09/24  6.  Pt will report sensation of complete bladder emptying in order to reduce number of UTIs.  Baseline: does not feel complete bladder emptying Goal status:  IN PROGRESS 02/09/24  LONG TERM GOALS: Target date: 06/14/2024  Pt will be independent with advanced HEP in order to improve activity tolerance.   Baseline:  Goal status:  IN PROGRESS 02/09/24  2.  Patient will  report 75% improve in abdominal/low back pain in order to increase activity tolerance.   Baseline: 5/10 Goal status:  IN PROGRESS 02/09/24  3.  Pt will be able to go 2-3 hours in between voids without urgency or incontinence in order to work without having to go to the bathroom as often.   Baseline: 4x/hour Goal status:  IN PROGRESS 02/09/24  4.  Pt will report 0/10 pain with vaginal penetration in order to improve intimate relationship with partner.    Baseline:  Goal status:  IN PROGRESS 02/09/24  5.  Pt deny any UTIs in  order in the last month in order to prevent life threatening infection.  Baseline: frequent UTIs Goal status:  IN PROGRESS 02/09/24  6.  Pt will demonstrate normal pelvic floor muscle tone and A/ROM, able to achieve 3/5 strength with contractions and 10 sec endurance, in order to reduce urinary leaking and number of pads patient wears.   Baseline: high tone without full relaxation, 1/5 strength Goal status:  IN PROGRESS 02/09/24  PLAN:  PT FREQUENCY: 1-2x/week  PT DURATION: 14 visits    PLANNED INTERVENTIONS: 97164- PT Re-evaluation, 97110-Therapeutic exercises, 97530- Therapeutic activity, 97112- Neuromuscular re-education, 97535- Self Care, 02859- Manual therapy, [phone]- Gait training, [phone]- Aquatic Therapy, [phone]- Electrical stimulation (unattended), [phone]- Traction (mechanical), [id number]- Ionotophoresis 4mg /ml Dexamethasone, 79439 (1-2 muscles), 20561 (3+ muscles)- Dry Needling, Patient/Family education, Balance training, Taping, Joint mobilization, Joint manipulation, Spinal manipulation, Spinal mobilization, Scar mobilization, Vestibular training, Cryotherapy, Moist heat, and Biofeedback  PLAN FOR NEXT SESSION: Progress down training, manual techniques to abdominal, internal pelvic floor muscle release, diaphragmatic breathing training, core strengthening  Josette Mares, PT, DPT11/17/259:37 AM

## 2024-03-15 ENCOUNTER — Ambulatory Visit

## 2024-03-15 DIAGNOSIS — R279 Unspecified lack of coordination: Secondary | ICD-10-CM

## 2024-03-15 DIAGNOSIS — R293 Abnormal posture: Secondary | ICD-10-CM

## 2024-03-15 DIAGNOSIS — M6281 Muscle weakness (generalized): Secondary | ICD-10-CM

## 2024-03-15 DIAGNOSIS — M5459 Other low back pain: Secondary | ICD-10-CM

## 2024-03-15 DIAGNOSIS — M62838 Other muscle spasm: Secondary | ICD-10-CM

## 2024-03-15 DIAGNOSIS — R1030 Lower abdominal pain, unspecified: Secondary | ICD-10-CM | POA: Diagnosis not present

## 2024-03-15 NOTE — Therapy (Signed)
 OUTPATIENT PHYSICAL THERAPY FEMALE PELVIC TREATMENT   Patient Name: Melissa Odonnell MRN: [id number] DOB:November 25, 1980, 43 y.o., female Today's Date: 03/15/2024  END OF SESSION:  PT End of Session - 03/15/24 0926     Visit Number 7    Date for Recertification  06/14/24    Authorization Type [employer]    Authorization Time Period 12/29/2023 - 03/22/2024    Authorization - Visit Number 6    Authorization - Number of Visits 14    PT Start Time 0931    PT Stop Time 1010    PT Time Calculation (min) 39 min    Activity Tolerance Patient tolerated treatment well    Behavior During Therapy [name] for tasks assessed/performed              Past Medical History:  Diagnosis Date   Patient denies medical problems    TB (tuberculosis)    Past Surgical History:  Procedure Laterality Date   two c-sections     Patient Active Problem List   Diagnosis Date Noted   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    PCP: NA  REFERRING PROVIDER: Devere Lonni Righter, MD    REFERRING DIAG: 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   PAIN: 03/01/24 Are you having pain? Yes NPRS scale: 0/10 Pain location: Rt side of low back; pressure in lower abdomen; Rt lower abdominal pain  Pain type: aching Pain description: intermittent   Aggravating factors: having to urinate,  sitting, sleeping  Relieving factors: exercise  PRECAUTIONS: None  RED FLAGS: None   WEIGHT BEARING RESTRICTIONS: No  FALLS:  Has patient fallen in last 6 months? No  OCCUPATION: cleaning  ACTIVITY LEVEL : weight training, pilates  PLOF: Independent  PATIENT GOALS: be able to use the bathroom better   PERTINENT HISTORY:  2 c-sections, chronic UTIs  Sexual abuse: No  BOWEL MOVEMENT: Pain with bowel movement: No Type of bowel movement:Frequency 2x/day and Strain yes Fully empty rectum: Yes:   Leakage: No Pads: Yes: see below Fiber supplement/laxative  No  URINATION: Pain with urination: No Fully empty bladder: No Stream: starts and stops, sometimes slow stream Urgency: Yes - doesn't have to go, but has sensation she needs to go Frequency: up to 4x/hour during the day; 2x/night Fluid Intake: 60-90 oz; she does drink coffee  Leakage: none Pads: Yes: feels like there is a little there after she wipes and does not want this in her underwear  INTERCOURSE:  Ability to have vaginal penetration Yes  Pain with intercourse: Initial Penetration, During Penetration, and Deep Penetration DrynessYes  Climax: WNL [name] Scale: 2/3 Lubricant: no  PREGNANCY: Vaginal deliveries 0 Tearing No Episiotomy No C-section deliveries 2 Currently pregnant No  PROLAPSE: Pressure   OBJECTIVE:  Note: Objective measures were completed at Evaluation unless otherwise noted.  12/29/23: PATIENT SURVEYS:   PFIQ-7: 27  COGNITION: Overall cognitive status: Within functional limits for tasks assessed     SENSATION: Light touch: Appears intact   FUNCTIONAL TESTS:  Squat: WNL Single leg stance:  Rt: pelvic drop  Lt: pelvic drop Curl-up test: no distortion    GAIT: Assistive device utilized: None Comments: WNL  POSTURE: decreased lumbar lordosis, decreased thoracic kyphosis, and posterior pelvic tilt   LUMBARAROM/PROM:  A/PROM A/PROM  Eval (% available)  Flexion 75   Extension 25, Rt abdominal pain  Right lateral flexion 75  Left lateral flexion 75  Right rotation 75  Left rotation 75   (Blank rows = not tested)  PALPATION:   General: rigger points Rt glutes   Pelvic Alignment: Rt posterior rotation  Abdominal: tightness throughout bil lower abdomen; increased pain and tightness in Rt lower quadrant compared to LT                External Perineal Exam: WNL                             Internal Pelvic Floor: significant tenderness throughout bil superficial and deep layers; burning in superficial, aching in deep  Patient confirms identification and approves PT to assess internal pelvic floor and treatment Yes  PELVIC MMT:   MMT eval  Vaginal 1/5, 3 repeat contractions  Diastasis Recti 2 finger widths   (Blank rows = not tested)        TONE: High, bil  PROLAPSE: WNL  TODAY'S TREATMENT:                                                                                                                              DATE:  03/15/24 Neuromuscular re-education: Bridge + hip adduction 10x Bridge march 10x bil Bridge + hip abduction 2 x 10 Modified bird dog 2 x 10 bil Standing shoulder extensions + green band 2 x 10 Pallof press + green band 10x bil Exercises: Lower trunk rotation 2 x 10 Single knee to chest  5x bil Standing hip circles 10x bil Seated piriformis stretch 60 sec bil Seated hamstring  stretch 60 sec bil Therapeutic activities: Squats to table + 5 lbs 2 x 10 Standing 3 way kick 10x each, bil Sidestepping + red band 5 10 step laps bil    03/08/24 Manual: Lt sidelying Rt lumbar paraspinal/oblique/quadratus lumborum release Neuromuscular re-education: Supine dead bug 2 x 10 Reverse table top full shoulder flexion + 10 lbs 2 x 10 Half kneeling chop 5 lbs bil 10x bil Standing march + 90 degrees shoulder flexion + 5 lbs 2 x 10 Bird dog 2 x 10x Bear plank 10x Standing shoulder extensions + green band 2 x 10 Pallof press  + green band 10x bil Exercises: Lower trunk rotation 2 x 10 Sidelying modified plank with clam shells 2 x 10 bil  03/01/24 Manual: Lt sidelying Rt lumbar paraspinal/oblique/quadratus lumborum release Neuromuscular re-education: Bridge with 03-03-2025x 10 Supine dead bug 10x bil Supine full shoulder flexion + 10 lbs 2 x 10 Bear plank 10x Exercises: Sidelying hip abduction 2 x 10 bil Sidelying hip adduction 2 x 10 bil Sidelying modified plank with clam shells 2 x 10 bil Open books 10x bil Lower trunk rotation 2 x 10 Cat cow 10x   02/24/24 Manual: Prone myofascial release to bil lumbar paraspinals, obliques, and quadratus lumborum with use of negative pressure soft tissue mobilization  Prone trigger point release to Rt quadratus lumborum Supine abdominal c-section scar tissue mobilization and Rt lower quadrant release  Exercises: Supine active straight leg raise 15x bil Sidelying hip abduction 15x bil Sidelying modified plank with clam shells 10x bil   PATIENT EDUCATION:  Education details: See above Person educated: Patient Education method: Programmer, Multimedia, Demonstration, Tactile cues, Verbal cues, and Handouts Education comprehension: verbalized understanding  HOME EXERCISE PROGRAM: [id number]  ASSESSMENT:  CLINICAL IMPRESSION: Patient is a 43 y.o. female who was seen today for physical therapy treatment for incomplete bladder emptying/frequent UTIs and low back/abdominal pain. Pt doing excellent today without any low back pain. We focused only on exercises today to see how she tolerates no manual techniques since we are planning to discharge next session. Good toelrance to all exercises with good breathing and core control. She will continue to benefit from skilled PT intervention in order to decrease pain, improve bladder emptying, improve pain with intercourse, address impairments, and improve quality of life.   OBJECTIVE IMPAIRMENTS: decreased activity tolerance, decreased  coordination, decreased endurance, decreased mobility, decreased ROM, decreased strength, increased fascial restrictions, increased muscle spasms, impaired flexibility, impaired tone, improper body mechanics, postural dysfunction, and pain.   ACTIVITY LIMITATIONS: sitting, sleeping, and urinating  PARTICIPATION LIMITATIONS: cleaning, interpersonal relationship, driving, community activity, and occupation  PERSONAL FACTORS: 1 comorbidity: medical history are also affecting patient's functional outcome.   REHAB POTENTIAL: Good  CLINICAL DECISION MAKING: Stable/uncomplicated  EVALUATION COMPLEXITY: Low   GOALS: Goals reviewed with patient? Yes  SHORT TERM GOALS: Target date: 01/26/2024    Pt will be independent with HEP in order to improve activity tolerance.   Baseline: Goal status: MET 02/09/24  2.  Patient will report 25% improve in abdominal/low back pain in order to increase activity tolerance.   Baseline: 5/10; currently no pain Goal status:MET 11/10  3.  Pt will be independent with double voiding in order to more complete empty bladder.   Baseline: not suing  Goal status:  MET 03/15/24  4.  Pt will be independent with diaphragmatic breathing and down training activities in order to improve pelvic floor relaxation and complete bladder emptying.  Baseline:  Goal status:  MET  03/15/24  5.  Pt will decrease frequency of urination to 1x/hour in order to decrease time spent in the bathroom and demonstrate more complete bladder emptying.  Baseline: 4x/hour Goal status:  MET 03/15/24  6.  Pt will report sensation of complete bladder emptying in order to reduce number of UTIs.  Baseline: does not feel complete bladder emptying Goal status:  MET 03/15/24  LONG TERM GOALS: Target date: 06/14/2024  Pt will be independent with advanced HEP in order to improve activity tolerance.   Baseline:  Goal status:  IN PROGRESS 03/15/24  2.  Patient will report 75% improve in  abdominal/low back pain in order to increase activity tolerance.   Baseline: 5/10 Goal status: MET 03/15/24  3.  Pt will be able to go 2-3 hours in between voids without urgency or incontinence in order to work without having to go to the bathroom as often.   Baseline: 4x/hour Goal status:  IN PROGRESS 03/15/24  4.  Pt will report 0/10 pain with vaginal penetration in order to improve intimate relationship with partner.    Baseline:  Goal status:  IN PROGRESS 02/09/24  5.  Pt deny any UTIs in order in the last month in order to prevent life threatening infection.  Baseline: frequent UTIs Goal status:  MET 03/15/24  6.  Pt will demonstrate normal pelvic floor muscle tone and A/ROM, able to achieve 3/5 strength with contractions and 10 sec endurance, in order to reduce urinary leaking and number of pads patient wears.   Baseline: high tone without full relaxation, 1/5 strength Goal status:  IN PROGRESS 03/15/24  PLAN:  PT FREQUENCY: 1-2x/week  PT DURATION: 14 visits    PLANNED INTERVENTIONS: 97164- PT Re-evaluation, 97110-Therapeutic exercises, 97530- Therapeutic activity, 97112- Neuromuscular re-education, 97535- Self Care, 02859- Manual therapy, [phone]- Gait training, [phone]- Aquatic Therapy, [phone]- Electrical stimulation (unattended), [phone]- Traction (mechanical), [id number]- Ionotophoresis 4mg /ml Dexamethasone, 79439 (1-2 muscles), 20561 (3+ muscles)- Dry Needling, Patient/Family education, Balance training, Taping, Joint mobilization, Joint manipulation, Spinal manipulation, Spinal mobilization, Scar mobilization, Vestibular training, Cryotherapy, Moist heat, and Biofeedback  PLAN FOR NEXT SESSION: Progress down training, manual techniques to abdominal, internal pelvic floor muscle release, diaphragmatic breathing training, core strengthening -   Josette Mares, PT, DPT11/24/259:56 AM [facility] [address] [place], [place] [address] Phone #  [phone] Fax [phone]

## 2024-03-22 ENCOUNTER — Ambulatory Visit: Attending: Urology

## 2024-03-22 DIAGNOSIS — R293 Abnormal posture: Secondary | ICD-10-CM | POA: Diagnosis present

## 2024-03-22 DIAGNOSIS — R279 Unspecified lack of coordination: Secondary | ICD-10-CM | POA: Diagnosis present

## 2024-03-22 DIAGNOSIS — R1030 Lower abdominal pain, unspecified: Secondary | ICD-10-CM | POA: Insufficient documentation

## 2024-03-22 DIAGNOSIS — M62838 Other muscle spasm: Secondary | ICD-10-CM | POA: Diagnosis present

## 2024-03-22 DIAGNOSIS — M6281 Muscle weakness (generalized): Secondary | ICD-10-CM | POA: Diagnosis present

## 2024-03-22 DIAGNOSIS — M5459 Other low back pain: Secondary | ICD-10-CM | POA: Insufficient documentation

## 2024-03-22 NOTE — Therapy (Signed)
 OUTPATIENT PHYSICAL THERAPY FEMALE PELVIC TREATMENT   Patient Name: Melissa Odonnell MRN: [id number] DOB:11/13/1980, 43 y.o., female Today's Date: 03/22/2024  END OF SESSION:  PT End of Session - 03/22/24 0937     Visit Number 8    Date for Recertification  06/14/24    Authorization Type [employer]    Authorization Time Period 12/29/2023 - 03/22/2024    Authorization - Visit Number 7    Authorization - Number of Visits 14    PT Start Time 0936    PT Stop Time 1014    PT Time Calculation (min) 38 min    Activity Tolerance Patient tolerated treatment well    Behavior During Therapy [facility] for tasks assessed/performed              Past Medical History:  Diagnosis Date   Patient denies medical problems    TB (tuberculosis)    Past Surgical History:  Procedure Laterality Date   two c-sections     Patient Active Problem List   Diagnosis Date Noted   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    PCP: NA  REFERRING PROVIDER: Devere Lonni Righter, MD    REFERRING DIAG: 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   PAIN: 03/01/24 Are you having pain? Yes NPRS scale: 0/10 Pain location: Rt side of low back; pressure in lower abdomen; Rt lower abdominal pain  Pain type: aching Pain description: intermittent   Aggravating factors: having to urinate, sitting,  sleeping  Relieving factors: exercise  PRECAUTIONS: None  RED FLAGS: None   WEIGHT BEARING RESTRICTIONS: No  FALLS:  Has patient fallen in last 6 months? No  OCCUPATION: cleaning  ACTIVITY LEVEL : weight training, pilates  PLOF: Independent  PATIENT GOALS: be able to use the bathroom better   PERTINENT HISTORY:  2 c-sections, chronic UTIs  Sexual abuse: No  BOWEL MOVEMENT: Pain with bowel movement: No Type of bowel movement:Frequency 2x/day and Strain yes Fully empty rectum: Yes:   Leakage: No Pads: Yes: see below Fiber supplement/laxative  No  URINATION: Pain with urination: No Fully empty bladder: No Stream: starts and stops, sometimes slow stream Urgency: Yes - doesn't have to go, but has sensation she needs to go Frequency: up to 4x/hour during the day; 2x/night Fluid Intake: 60-90 oz; she does drink coffee  Leakage: none Pads: Yes: feels like there is a little there after she wipes and does not want this in her underwear  INTERCOURSE:  Ability to have vaginal penetration Yes  Pain with intercourse: Initial Penetration, During Penetration, and Deep Penetration DrynessYes  Climax: WNL [name] Scale: 2/3 Lubricant: no  PREGNANCY: Vaginal deliveries 0 Tearing No Episiotomy No C-section deliveries 2 Currently pregnant No  PROLAPSE: Pressure   OBJECTIVE:  Note: Objective measures were completed at Evaluation unless otherwise noted.  12/29/23: PATIENT SURVEYS:   PFIQ-7: 66  COGNITION: Overall cognitive status: Within functional limits for tasks assessed     SENSATION: Light touch: Appears intact   FUNCTIONAL TESTS:  Squat: WNL Single leg stance:  Rt: pelvic drop  Lt: pelvic drop Curl-up test: no distortion    GAIT: Assistive device utilized: None Comments: WNL  POSTURE: decreased lumbar lordosis, decreased thoracic kyphosis, and posterior pelvic tilt   LUMBARAROM/PROM:  A/PROM A/PROM  Eval (% available)  Flexion 75   Extension 25, Rt abdominal pain  Right lateral flexion 75  Left lateral flexion 75  Right rotation 75  Left rotation 75   (Blank rows = not tested)  PALPATION:   General: rigger points Rt glutes   Pelvic Alignment: Rt posterior rotation  Abdominal: tightness throughout bil lower abdomen; increased pain and tightness in Rt lower quadrant compared to LT                External Perineal Exam: WNL                             Internal Pelvic Floor: significant tenderness throughout bil superficial and deep layers; burning in superficial, aching in deep  Patient confirms identification and approves PT to assess internal pelvic floor and treatment Yes  PELVIC MMT:   MMT eval  Vaginal 1/5, 3 repeat contractions  Diastasis Recti 2 finger widths   (Blank rows = not tested)        TONE: High, bil  PROLAPSE: WNL  TODAY'S TREATMENT:                                                                                                                              DATE:  03/22/24 Manual: Prone lumbar paraspinal/oblique/quadratus lumborum release Neuromuscular re-education: Bridge march 10x bil Dead bug 2 x 10 Exercises: Prone windshield wipers 2 x 10 Cat cow 2 x 10 Therapeutic activities: Squats to table + 5 lbs 2 x 10 Standing 3 way kick 10x each, bil Sidestepping + red band 5 10 step laps bil    03/15/24 Neuromuscular re-education: Bridge + hip adduction 10x Bridge  march 10x bil Bridge + hip abduction 2 x 10 Modified bird dog 2 x 10 bil Standing shoulder extensions + green band 2 x 10 Pallof press + green band 10x bil Exercises: Lower trunk rotation 2 x 10 Single knee to chest  5x bil Standing hip circles 10x bil Seated piriformis stretch 60 sec bil Seated hamstring stretch 60 sec bil Therapeutic activities: Squats to table + 5 lbs 2 x 10 Standing 3 way kick 10x each, bil Sidestepping + red band 5 10 step laps bil    03/08/24 Manual: Lt sidelying Rt lumbar  paraspinal/oblique/quadratus lumborum release Neuromuscular re-education: Supine dead bug 2 x 10 Reverse table top full shoulder flexion + 10 lbs 2 x 10 Half kneeling chop 5 lbs bil 10x bil Standing march + 90 degrees shoulder flexion + 5 lbs 2 x 10 Bird dog 2 x 10x Bear plank 10x Standing shoulder extensions + green band 2 x 10 Pallof press + green band 10x bil Exercises: Lower trunk rotation 2 x 10 Sidelying modified plank with clam shells 2 x 10 bil    PATIENT EDUCATION:  Education details: See above Person educated: Patient Education method: Explanation, Demonstration, Tactile cues, Verbal cues, and Handouts Education comprehension: verbalized understanding  HOME EXERCISE PROGRAM: [id number]  ASSESSMENT:  CLINICAL IMPRESSION: Patient is a 43 y.o. female who was seen today for physical therapy treatment for incomplete bladder emptying/frequent UTIs and low back/abdominal pain. Pt has done very well overall with no recent UTIs, decreased low back and abdominal pain, and decreased urinary frequency. She is prepared to discharge at this time due to having met goals for being in physical therapy. She was encouraged to call with any questions or concerns.   OBJECTIVE IMPAIRMENTS: decreased activity tolerance, decreased coordination, decreased endurance, decreased mobility, decreased ROM, decreased strength, increased fascial restrictions, increased muscle spasms, impaired flexibility, impaired tone, improper body mechanics, postural dysfunction, and pain.   ACTIVITY LIMITATIONS: sitting, sleeping, and urinating  PARTICIPATION LIMITATIONS: cleaning, interpersonal relationship, driving, community activity, and occupation  PERSONAL FACTORS: 1 comorbidity: medical history are also affecting patient's functional outcome.   REHAB POTENTIAL: Good  CLINICAL DECISION MAKING: Stable/uncomplicated  EVALUATION COMPLEXITY: Low   GOALS: Goals reviewed with patient? Yes  SHORT TERM  GOALS: Target date: 01/26/2024    Pt will be independent with HEP in order to improve activity tolerance.   Baseline: Goal status: MET 02/09/24  2.  Patient will report 25% improve in abdominal/low back pain in order to increase activity tolerance.   Baseline: 5/10; currently no pain Goal status:MET 11/10  3.  Pt will be independent with double voiding in order to more complete empty bladder.   Baseline: not suing  Goal status:  MET 03/15/24  4.  Pt will be independent with diaphragmatic breathing and down training activities in order to improve pelvic floor relaxation and complete bladder emptying.  Baseline:  Goal status:  MET 03/15/24  5.  Pt will decrease frequency of urination to 1x/hour in order to decrease time spent in the bathroom and demonstrate more complete bladder emptying.  Baseline: 4x/hour Goal status:  MET 03/15/24  6.  Pt will report sensation of complete bladder emptying in order to reduce number of UTIs.  Baseline: does not feel complete bladder emptying Goal status:  MET 03/15/24  LONG TERM GOALS: Target date: 06/14/2024  Pt will be independent with advanced HEP in order to improve activity tolerance.   Baseline:  Goal status:  MET 03/22/24  2.  Patient  will report 75% improve in abdominal/low back pain in order to increase activity tolerance.   Baseline: 5/10 Goal status: MET 03/15/24  3.  Pt will be able to go 2-3 hours in between voids without urgency or incontinence in order to work without having to go to the bathroom as often.   Baseline: 4x/hour Goal status:  MET 03/22/24  4.  Pt will report 0/10 pain with vaginal penetration in order to improve intimate relationship with partner.    Baseline:  Goal status:  MET 03/22/24  5.  Pt deny any UTIs in order in the last month in order to prevent life threatening infection.  Baseline: frequent UTIs Goal status:  MET 03/15/24  6.  Pt will demonstrate normal pelvic floor muscle tone and A/ROM,  able to achieve 3/5 strength with contractions and 10 sec endurance, in order to reduce urinary leaking and number of pads patient wears.   Baseline: high tone without full relaxation, 1/5 strength Goal status: DISCHARGED 03/22/24  PLAN:  PT FREQUENCY: -  PT DURATION: -  PLANNED INTERVENTIONS: -  PLAN FOR NEXT SESSION: Discharge   PHYSICAL THERAPY DISCHARGE SUMMARY  Visits from Start of Care: 8  Current functional level related to goals / functional outcomes: Independent   Remaining deficits: See above   Education / Equipment: HEP   Patient agrees to discharge. Patient goals were partially met. Patient is being discharged due to being pleased with the current functional level.    Josette Mares, PT, DPT12/01/259:38 AM [facility] [address], [address] [place], [place] [address] Phone # [phone] Fax [phone]
